# Patient Record
Sex: Female | Born: 1955 | Race: Black or African American | Hispanic: No | Marital: Single | Smoking: Current every day smoker
Health system: Southern US, Community
[De-identification: ages and names within clinical notes are randomized; demographics above are authoritative.]

## PROBLEM LIST (undated history)

## (undated) DIAGNOSIS — S42402A Unspecified fracture of lower end of left humerus, initial encounter for closed fracture: Secondary | ICD-10-CM

---

## 1999-06-16 ENCOUNTER — Emergency Department (HOSPITAL_COMMUNITY): Admission: EM | Admit: 1999-06-16 | Discharge: 1999-06-16 | Payer: Self-pay | Admitting: Emergency Medicine

## 2001-01-22 ENCOUNTER — Emergency Department (HOSPITAL_COMMUNITY): Admission: EM | Admit: 2001-01-22 | Discharge: 2001-01-22 | Payer: Self-pay | Admitting: Emergency Medicine

## 2002-06-10 ENCOUNTER — Emergency Department (HOSPITAL_COMMUNITY): Admission: EM | Admit: 2002-06-10 | Discharge: 2002-06-10 | Payer: Self-pay | Admitting: Emergency Medicine

## 2004-01-22 ENCOUNTER — Emergency Department (HOSPITAL_COMMUNITY): Admission: EM | Admit: 2004-01-22 | Discharge: 2004-01-22 | Payer: Self-pay | Admitting: Emergency Medicine

## 2004-09-28 ENCOUNTER — Emergency Department (HOSPITAL_COMMUNITY): Admission: EM | Admit: 2004-09-28 | Discharge: 2004-09-28 | Payer: Self-pay | Admitting: Emergency Medicine

## 2007-09-25 ENCOUNTER — Emergency Department (HOSPITAL_COMMUNITY): Admission: EM | Admit: 2007-09-25 | Discharge: 2007-09-25 | Payer: Self-pay | Admitting: Family Medicine

## 2009-09-09 ENCOUNTER — Emergency Department (HOSPITAL_COMMUNITY): Admission: EM | Admit: 2009-09-09 | Discharge: 2009-09-09 | Payer: Self-pay | Admitting: Family Medicine

## 2009-11-09 ENCOUNTER — Emergency Department (HOSPITAL_COMMUNITY): Admission: EM | Admit: 2009-11-09 | Discharge: 2009-11-09 | Payer: Self-pay | Admitting: Emergency Medicine

## 2010-08-09 ENCOUNTER — Encounter (INDEPENDENT_AMBULATORY_CARE_PROVIDER_SITE_OTHER): Payer: Self-pay | Admitting: General Surgery

## 2010-08-30 ENCOUNTER — Inpatient Hospital Stay (HOSPITAL_COMMUNITY): Admission: EM | Admit: 2010-08-30 | Discharge: 2010-08-10 | Payer: Self-pay | Admitting: Emergency Medicine

## 2010-12-04 LAB — DIFFERENTIAL
Basophils Absolute: 0 10*3/uL (ref 0.0–0.1)
Eosinophils Relative: 1 % (ref 0–5)
Lymphocytes Relative: 14 % (ref 12–46)
Lymphs Abs: 1.5 10*3/uL (ref 0.7–4.0)
Neutrophils Relative %: 79 % — ABNORMAL HIGH (ref 43–77)

## 2010-12-04 LAB — COMPREHENSIVE METABOLIC PANEL
AST: 20 U/L (ref 0–37)
Alkaline Phosphatase: 67 U/L (ref 39–117)
BUN: 5 mg/dL — ABNORMAL LOW (ref 6–23)
BUN: 9 mg/dL (ref 6–23)
CO2: 27 mEq/L (ref 19–32)
Calcium: 9.1 mg/dL (ref 8.4–10.5)
Chloride: 103 mEq/L (ref 96–112)
Chloride: 104 mEq/L (ref 96–112)
Creatinine, Ser: 0.94 mg/dL (ref 0.4–1.2)
Creatinine, Ser: 0.97 mg/dL (ref 0.4–1.2)
GFR calc Af Amer: >60 mL/min (ref >60)
GFR calc non Af Amer: >60 mL/min (ref >60)
GFR calc non Af Amer: >60 mL/min (ref >60)
Glucose, Bld: 134 mg/dL — ABNORMAL HIGH (ref 70–99)
Glucose, Bld: 84 mg/dL (ref 70–99)
Potassium: 3.6 mEq/L (ref 3.5–5.1)
Total Bilirubin: 0.6 mg/dL (ref 0.3–1.2)
Total Bilirubin: 0.7 mg/dL (ref 0.3–1.2)

## 2010-12-04 LAB — URINE MICROSCOPIC-ADD ON

## 2010-12-04 LAB — URINALYSIS, ROUTINE W REFLEX MICROSCOPIC
Bilirubin Urine: NEGATIVE
Ketones, ur: 15 mg/dL — AB
Nitrite: NEGATIVE
Protein, ur: 30 mg/dL — AB
Specific Gravity, Urine: 1.023 (ref 1.005–1.030)
Urobilinogen, UA: 1 mg/dL (ref 0.0–1.0)

## 2010-12-04 LAB — CBC
HCT: 33.4 % — ABNORMAL LOW (ref 36.0–46.0)
HCT: 37.9 % (ref 36.0–46.0)
Hemoglobin: 12.8 g/dL (ref 12.0–15.0)
MCH: 32.6 pg (ref 26.0–34.0)
MCH: 32.7 pg (ref 26.0–34.0)
MCHC: 33.8 g/dL (ref 30.0–36.0)
MCV: 96.5 fL (ref 78.0–100.0)
MCV: 97.2 fL (ref 78.0–100.0)
RBC: 3.43 MIL/uL — ABNORMAL LOW (ref 3.87–5.11)
RBC: 3.93 MIL/uL (ref 3.87–5.11)
RDW: 14.5 % (ref 11.5–15.5)
WBC: 7.1 10*3/uL (ref 4.0–10.5)

## 2010-12-04 LAB — PREGNANCY, URINE: Preg Test, Ur: NEGATIVE

## 2010-12-04 LAB — RAPID URINE DRUG SCREEN, HOSP PERFORMED
Barbiturates: NOT DETECTED
Cocaine: POSITIVE — AB

## 2010-12-04 LAB — LIPASE, BLOOD: Lipase: 23 U/L (ref 11–59)

## 2010-12-13 LAB — DIFFERENTIAL
Eosinophils Absolute: 0.2 10*3/uL (ref 0.0–0.7)
Eosinophils Relative: 3 % (ref 0–5)
Lymphs Abs: 1.9 10*3/uL (ref 0.7–4.0)
Monocytes Relative: 9 % (ref 3–12)

## 2010-12-13 LAB — CBC
HCT: 46.8 % — ABNORMAL HIGH (ref 36.0–46.0)
MCV: 94.7 fL (ref 78.0–100.0)
Platelets: 380 10*3/uL (ref 150–400)
WBC: 8.5 10*3/uL (ref 4.0–10.5)

## 2010-12-13 LAB — RAPID URINE DRUG SCREEN, HOSP PERFORMED
Barbiturates: NOT DETECTED
Barbiturates: NOT DETECTED
Cocaine: POSITIVE — AB
Opiates: NOT DETECTED
Opiates: NOT DETECTED
Tetrahydrocannabinol: NOT DETECTED

## 2010-12-13 LAB — URINE CULTURE

## 2010-12-13 LAB — POCT CARDIAC MARKERS
CKMB, poc: 1.3 ng/mL (ref 1.0–8.0)
Troponin i, poc: <0.05 ng/mL (ref 0.00–0.09)

## 2010-12-13 LAB — BASIC METABOLIC PANEL
BUN: 12 mg/dL (ref 6–23)
Chloride: 94 mEq/L — ABNORMAL LOW (ref 96–112)
Potassium: 3.4 mEq/L — ABNORMAL LOW (ref 3.5–5.1)

## 2010-12-13 LAB — PROTIME-INR: Prothrombin Time: 12.3 seconds (ref 11.6–15.2)

## 2010-12-13 LAB — POCT URINALYSIS DIP (DEVICE)
Bilirubin Urine: NEGATIVE
Glucose, UA: NEGATIVE mg/dL
Nitrite: NEGATIVE

## 2010-12-24 LAB — POCT URINALYSIS DIP (DEVICE)
Bilirubin Urine: NEGATIVE
Glucose, UA: NEGATIVE mg/dL
Ketones, ur: NEGATIVE mg/dL
Specific Gravity, Urine: <=1.005 (ref 1.005–1.030)

## 2011-07-23 ENCOUNTER — Emergency Department (HOSPITAL_COMMUNITY)
Admission: EM | Admit: 2011-07-23 | Discharge: 2011-07-23 | Disposition: A | Discharge status: Home / Self Care | Payer: Self-pay | Attending: Emergency Medicine | Admitting: Emergency Medicine

## 2011-07-23 DIAGNOSIS — M129 Arthropathy, unspecified: Secondary | ICD-10-CM | POA: Insufficient documentation

## 2011-07-23 DIAGNOSIS — M25529 Pain in unspecified elbow: Secondary | ICD-10-CM | POA: Insufficient documentation

## 2011-07-23 DIAGNOSIS — M25569 Pain in unspecified knee: Secondary | ICD-10-CM | POA: Insufficient documentation

## 2014-04-27 ENCOUNTER — Emergency Department (INDEPENDENT_AMBULATORY_CARE_PROVIDER_SITE_OTHER): Payer: Self-pay

## 2014-04-27 ENCOUNTER — Encounter (HOSPITAL_COMMUNITY): Payer: Self-pay | Admitting: Emergency Medicine

## 2014-04-27 ENCOUNTER — Emergency Department (INDEPENDENT_AMBULATORY_CARE_PROVIDER_SITE_OTHER)
Admission: EM | Admit: 2014-04-27 | Discharge: 2014-04-27 | Disposition: A | Discharge status: Home / Self Care | Payer: Self-pay | Source: Home / Self Care | Attending: Emergency Medicine | Admitting: Emergency Medicine

## 2014-04-27 DIAGNOSIS — S59909A Unspecified injury of unspecified elbow, initial encounter: Secondary | ICD-10-CM

## 2014-04-27 DIAGNOSIS — S59902A Unspecified injury of left elbow, initial encounter: Secondary | ICD-10-CM

## 2014-04-27 DIAGNOSIS — S6990XA Unspecified injury of unspecified wrist, hand and finger(s), initial encounter: Secondary | ICD-10-CM

## 2014-04-27 DIAGNOSIS — W06XXXA Fall from bed, initial encounter: Secondary | ICD-10-CM

## 2014-04-27 DIAGNOSIS — Z5189 Encounter for other specified aftercare: Secondary | ICD-10-CM

## 2014-04-27 DIAGNOSIS — S59919A Unspecified injury of unspecified forearm, initial encounter: Secondary | ICD-10-CM

## 2014-04-27 DIAGNOSIS — S5000XA Contusion of unspecified elbow, initial encounter: Secondary | ICD-10-CM

## 2014-04-27 MED ORDER — HYDROCODONE-ACETAMINOPHEN 5-325 MG PO TABS
ORAL_TABLET | ORAL | Status: AC
  Filled 2014-04-27: qty 1

## 2014-04-27 MED ORDER — HYDROCODONE-ACETAMINOPHEN 5-325 MG PO TABS: 1.0000 | ORAL_TABLET | Freq: Four times a day (QID) | ORAL | Status: DC | PRN

## 2014-04-27 MED ORDER — HYDROCODONE-ACETAMINOPHEN 5-325 MG PO TABS
1.0000 | ORAL_TABLET | Freq: Once | ORAL | Status: AC
  Administered 2014-04-27: 1 via ORAL

## 2014-04-27 NOTE — Discharge Instructions (Signed)
It's hard to tell if you broke your elbow again or if it is just flared up. We put a splint on your arm just in case.   Alternate tylenol and motrin every 4 hours as needed for pain. Take Norco every 6 hours as needed for severe pain.  Please call Dr. Debroah Loop office (an orthopedic doctor) and schedule an appointment for Friday or Monday.

## 2014-04-27 NOTE — ED Provider Notes (Signed)
CSN: [id number]     Arrival date & time 04/27/14  1214 History   First MD Initiated Contact with Patient 04/27/14 1254     Chief Complaint  Patient presents with  . Elbow Injury   (Consider location/radiation/quality/duration/timing/severity/associated sxs/prior Treatment) HPI She is here today for evaluation of left elbow pain and swelling. She states between 5 and 6 this morning she fell out of bed. She had immediate throbbing pain in her left elbow. The pain has stayed constant to slightly worse. It will shoot down her arm to her fingers. She also reports swelling over the back of the elbow. This seems to have stabilized. She is able to pronate and supinate her forearm with minimal pain. Elbow flexion and extension are limited secondary to pain. She denies any coldness, numbness, tingling in the left hand.  She has previously injured that elbow. She states she always has some difficulty with flexion and extension of the elbow, but it is currently quite a bit worse.  History reviewed. No pertinent past medical history. History reviewed. No pertinent past surgical history. History reviewed. No pertinent family history. History  Substance Use Topics  . Smoking status: Never Smoker   . Smokeless tobacco: Not on file  . Alcohol Use: No   OB History   Grav Para Term Preterm Abortions TAB SAB Ect Mult Living                 Review of Systems  Musculoskeletal:       Left elbow pain  Neurological: Negative for weakness and numbness.    Allergies  Review of patient's allergies indicates no known allergies.  Home Medications   Prior to Admission medications   Medication Sig Start Date End Date Taking? Authorizing Provider  HYDROcodone-acetaminophen (NORCO/VICODIN) 5-325 MG per tablet Take 1-2 tablets by mouth every 6 (six) hours as needed for moderate pain. 04/27/14   Melony Overly, MD   BP 164/95  Pulse 59  Temp(Src) 98.6 F (37 C) (Oral)  Resp 18  SpO2 99% Physical Exam   Constitutional: She is oriented to person, place, and time. She appears well-developed and well-nourished. No distress.  Thin woman  HENT:  Head: Normocephalic and atraumatic.  Cardiovascular: Normal rate.   Pulmonary/Chest: Effort normal.  Musculoskeletal:  Left elbow: swelling noted over olecranon; tender to palpation over posterior elbow and distal humerus; 5/5 grip strength; 2+ radial pulse; brisk cap refill in all digits; able to fully pronate and supinate the forearm; elbow flexion limited to 80 degrees, extension limited to 45 degrees  Neurological: She is alert and oriented to person, place, and time.  Skin: Skin is warm and dry.    ED Course  Procedures (including critical care time) Labs Review Labs Reviewed - No data to display  Imaging Review Dg Elbow Complete Left  04/27/2014   CLINICAL DATA:  Pain post trauma  EXAM: LEFT ELBOW - COMPLETE 3+ VIEW  COMPARISON:  None.  FINDINGS: Frontal, lateral, and bilateral oblique views were obtained. There is evidence of a previous fracture of the distal humeral metaphysis laterally extending into the elbow joint. There is remodeling in this area with mild nonunion at the fracture site. There is a small joint effusion.  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  No acute fracture or dislocation is appreciable.  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 While all the changes under seen may be chronic, there may be re-injury in this area. Note that there  is a small joint effusion. There is extensive osteoarthritic change. No dislocation appreciable.   Electronically Signed   By: Lowella Grip M.D.   On: 04/27/2014 13:13     MDM   1. Elbow injury, left, initial encounter    Spoke with Dr. Percell Miller of orthopedics. He recommended applying a long arm splint and followup in his office on Friday or Monday. Norco one tablet given in the office for pain control. Recommended alternating Tylenol  and Motrin for pain. Her prescription for Norco 5/325 mg, #20 provided. Followup with Dr. Percell Miller on Friday or Monday.    Melony Overly, MD 04/27/14 1330

## 2014-04-27 NOTE — ED Notes (Signed)
Reports falling out of bed this a.m injuring left elbow.  Limited ROM.  Swelling.  States "throbbing pain through out arm".

## 2014-04-27 NOTE — Progress Notes (Signed)
Orthopedic Tech Progress Note Patient Details:  Traci Buchanan 21-Nov-1955 [id number]  Ortho Devices Type of Ortho Device: Post (long arm) splint;Ace wrap;Arm sling Ortho Device/Splint Interventions: Application   Cammer, Theodoro Parma 04/27/2014, 2:05 PM

## 2014-04-27 NOTE — ED Notes (Signed)
Pt waiting ortho tech.  Mw,cma

## 2014-05-02 ENCOUNTER — Emergency Department (INDEPENDENT_AMBULATORY_CARE_PROVIDER_SITE_OTHER): Payer: Self-pay

## 2014-05-02 ENCOUNTER — Encounter (HOSPITAL_COMMUNITY): Payer: Self-pay | Admitting: Emergency Medicine

## 2014-05-02 ENCOUNTER — Emergency Department (INDEPENDENT_AMBULATORY_CARE_PROVIDER_SITE_OTHER)
Admission: EM | Admit: 2014-05-02 | Discharge: 2014-05-02 | Disposition: A | Discharge status: Home / Self Care | Payer: Self-pay | Source: Home / Self Care | Attending: Family Medicine | Admitting: Family Medicine

## 2014-05-02 DIAGNOSIS — Z5189 Encounter for other specified aftercare: Secondary | ICD-10-CM

## 2014-05-02 DIAGNOSIS — S5000XA Contusion of unspecified elbow, initial encounter: Secondary | ICD-10-CM

## 2014-05-02 DIAGNOSIS — S5002XD Contusion of left elbow, subsequent encounter: Secondary | ICD-10-CM

## 2014-05-02 DIAGNOSIS — W06XXXA Fall from bed, initial encounter: Secondary | ICD-10-CM

## 2014-05-02 HISTORY — DX: Unspecified fracture of lower end of left humerus, initial encounter for closed fracture: S42.402A

## 2014-05-02 MED ORDER — IBUPROFEN 800 MG PO TABS
800.0000 mg | ORAL_TABLET | Freq: Three times a day (TID) | ORAL | Status: DC
  Administered 2014-05-02: 800 mg via ORAL

## 2014-05-02 MED ORDER — IBUPROFEN 800 MG PO TABS
ORAL_TABLET | ORAL | Status: AC
  Filled 2014-05-02: qty 1

## 2014-05-02 NOTE — ED Provider Notes (Signed)
CSN: [id number]     Arrival date & time 05/02/14  1656 History   First MD Initiated Contact with Patient 05/02/14 1713     Chief Complaint  Patient presents with  . Elbow Pain   (Consider location/radiation/quality/duration/timing/severity/associated sxs/prior Treatment) Patient is a 59 y.o. female presenting with arm injury. The history is provided by the patient.  Arm Injury Location:  Elbow Injury: yes   Mechanism of injury: fall   Mechanism of injury comment:  [place] out of bed on 8/5 with injury to left elbow, was splinted, here for recheck. Fall:    Impact surface:  Carpet Elbow location:  L elbow Chronicity:  New Dislocation: no   Associated symptoms: decreased range of motion and stiffness   Risk factors comment:  Old injury , deformity of elbow.   Past Medical History  Diagnosis Date  . Elbow fracture, left    History reviewed. No pertinent past surgical history. No family history on file. History  Substance Use Topics  . Smoking status: Never Smoker   . Smokeless tobacco: Not on file  . Alcohol Use: No   OB History   Grav Para Term Preterm Abortions TAB SAB Ect Mult Living                 Review of Systems  Constitutional: Negative.   Musculoskeletal: Positive for joint swelling and stiffness.    Allergies  Review of patient's allergies indicates no known allergies.  Home Medications   Prior to Admission medications   Medication Sig Start Date End Date Taking? Authorizing Provider  HYDROcodone-acetaminophen (NORCO/VICODIN) 5-325 MG per tablet Take 1-2 tablets by mouth every 6 (six) hours as needed for moderate pain. 04/27/14   Melony Overly, MD   BP 143/93  Pulse 78  Temp(Src) 98.5 F (36.9 C) (Oral)  Resp 16  SpO2 99% Physical Exam  Nursing note and vitals reviewed. Constitutional: She is oriented to person, place, and time. She appears well-developed and well-nourished.  Musculoskeletal: She exhibits tenderness.       Left elbow: She exhibits  decreased range of motion, swelling and deformity.  Neurological: She is alert and oriented to person, place, and time.  Skin: Skin is warm and dry.    ED Course  Procedures (including critical care time) Labs Review Labs Reviewed - No data to display  Imaging Review Dg Elbow Complete Left  05/02/2014   CLINICAL DATA:  Fall 5 days ago. Left elbow injury and pain. Previous elbow fracture.  EXAM: LEFT ELBOW - COMPLETE 3+ VIEW  COMPARISON:  04/27/2014  FINDINGS: There is no evidence of acute fracture, dislocation, or joint effusion. There is no evidence of arthropathy or other focal bone abnormality. Soft tissues are unremarkable.  Old fracture deformity is seen involving the lateral condyle of the distal humerus. Severe osteoarthritis also demonstrated.  IMPRESSION: No acute findings.  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   Electronically Signed   By: Earle Gell M.D.   On: 05/02/2014 18:45     MDM   1. Contusion of left elbow, subsequent encounter        Billy Fischer, MD 05/02/14 [phone]

## 2014-05-02 NOTE — ED Notes (Signed)
Patient here for recheck of left elbow, seen last week for injury-8/5.  Patient fell out of bed.  Patient has injured this elbow prior to last weeks injury.  Radial pulse 2 plus, able to move fingers and arm

## 2014-05-02 NOTE — Discharge Instructions (Signed)
Wear sling for comfort, soak in warm water for soreness, advil as needed.

## 2014-07-28 ENCOUNTER — Emergency Department (INDEPENDENT_AMBULATORY_CARE_PROVIDER_SITE_OTHER)
Admission: EM | Admit: 2014-07-28 | Discharge: 2014-07-28 | Disposition: A | Discharge status: Home / Self Care | Payer: Self-pay | Source: Home / Self Care

## 2014-07-28 ENCOUNTER — Encounter (HOSPITAL_COMMUNITY): Payer: Self-pay | Admitting: *Deleted

## 2014-07-28 ENCOUNTER — Ambulatory Visit (HOSPITAL_COMMUNITY)
Admit: 2014-07-28 | Discharge: 2014-07-28 | Disposition: A | Discharge status: Home / Self Care | Payer: Self-pay | Source: Ambulatory Visit | Attending: Emergency Medicine | Admitting: Emergency Medicine

## 2014-07-28 DIAGNOSIS — R079 Chest pain, unspecified: Secondary | ICD-10-CM | POA: Insufficient documentation

## 2014-07-28 DIAGNOSIS — R05 Cough: Secondary | ICD-10-CM | POA: Insufficient documentation

## 2014-07-28 DIAGNOSIS — R0789 Other chest pain: Secondary | ICD-10-CM

## 2014-07-28 MED ORDER — HYDROCODONE-ACETAMINOPHEN 5-325 MG PO TABS
1.0000 | ORAL_TABLET | Freq: Once | ORAL | Status: AC
  Administered 2014-07-28: 1 via ORAL

## 2014-07-28 MED ORDER — HYDROCODONE-ACETAMINOPHEN 5-325 MG PO TABS
ORAL_TABLET | ORAL | Status: AC
  Filled 2014-07-28: qty 1

## 2014-07-28 MED ORDER — BENZONATATE 200 MG PO CAPS: 200.0000 mg | ORAL_CAPSULE | Freq: Three times a day (TID) | ORAL | Status: DC | PRN

## 2014-07-28 MED ORDER — MELOXICAM 15 MG PO TABS: 15.0000 mg | ORAL_TABLET | Freq: Every day | ORAL | Status: DC

## 2014-07-28 MED ORDER — HYDROCODONE-ACETAMINOPHEN 5-325 MG PO TABS: ORAL_TABLET | ORAL | Status: DC

## 2014-07-28 NOTE — ED Notes (Signed)
Pt  Reports  Pain r side x 5 days    She  Reports  It is  aggrevated by  Movement  posistions  And cough  - pt  Reports  Has  Had  Some  Cough  And congestion over last  sev  Days  - The  Patient  denys  Any  specefic  Injury   The  Pt is sitting   Upright  On the  Exam tablespeaking in complete  sentances  And is in no severe    Acute  Distress

## 2014-07-28 NOTE — ED Notes (Deleted)
Pt  Reports   Symptoms of  Burning  On urination    With  Symptoms x  sev  Weeks        -pt  denys  Any  Discharge  Or  Sores  On  Penis           -Pt   Wants  To be  Checked for  Std

## 2014-07-28 NOTE — Discharge Instructions (Signed)

## 2014-07-28 NOTE — ED Provider Notes (Signed)
Chief Complaint   Back Pain   History of Present Illness   Traci Buchanan is a 58 year old female who presents with right antero-lateral lower chest pain in the context of a URI.  The chest is tender to palpation and also with coughing, movement and deep inspiration.  She has a 2 week history of nasal congestion, rhinorrhea, sore throat and ear congestion.  She has also had cough productive of white sputum and slight shortness of breath.  She had fever at onset of URI symptoms 2 weeks ago, but none since then.    Review of Systems   Other than as noted above, the patient denies any of the following symptoms: Systemic:  No fevers, chills, sweats, or myalgias. Eye:  No redness or discharge. ENT:  No ear pain, headache, nasal congestion, drainage, sinus pressure, or sore throat. Neck:  No neck pain, stiffness, or swollen glands. Lungs:  No cough, sputum production, hemoptysis, wheezing, chest tightness, shortness of breath or chest pain. GI:  No abdominal pain, nausea, vomiting or diarrhea.  [place]   Past medical history, family history, social history, meds, and allergies were reviewed. She smokes 1/2 pack per day.   Physical exam   Vital signs:  BP 122/90 mmHg  Pulse 73  Temp(Src) 98.5 F (36.9 C) (Oral)  Resp 16  SpO2 100% General:  Alert and oriented.  In no distress.  Skin warm and dry. Eye:  No conjunctival injection or drainage. Lids were normal. ENT:  TMs and canals were normal, without erythema or inflammation.  Nasal mucosa was clear and uncongested, without drainage.  Mucous membranes were moist.  Pharynx was clear with no exudate or drainage.  There were no oral ulcerations or lesions. Neck:  Supple, no adenopathy, tenderness or mass. Lungs:  No respiratory distress.  Lungs were clear to auscultation, without wheezes, rales or rhonchi.  Breath sounds were clear and equal bilaterally.  Heart:  Regular rhythm, without gallops, murmers or rubs. Chest:  She has localized  tenderness to palpation at costal margin of right antero-lateral chest area.  Skin:  Clear, warm, and dry, without rash or lesions.  Radiology   Dg Ribs Unilateral W/chest Right  07/28/2014   CLINICAL DATA:  Cough. Pain in the right mid rib region. Upper respiratory infection for the past 2 weeks.  EXAM: RIGHT RIBS AND CHEST - 3+ VIEW  COMPARISON:  11/09/2009.  FINDINGS: Normal sized heart. Clear lungs. Mild scoliosis. No acute bony abnormality. Specifically, no abnormality of the right ribs.  IMPRESSION: No acute abnormality.   Electronically Signed   By: Enrique Sack M.D.   On: 07/28/2014 13:55   Course in Urgent [place]   The following medications were given:  Medications  HYDROcodone-acetaminophen (NORCO/VICODIN) 5-325 MG per tablet 1 tablet (1 tablet Oral Given 07/28/14 1320)   Assessment     The primary encounter diagnosis was Musculoskeletal chest pain. A diagnosis of Chest pain was also pertinent to this visit.  There is no evidence of pneumonia, strep throat, sinusitis, otitis media.    Plan    1.  Meds:  The following meds were prescribed:   Discharge Medication List as of 07/28/2014  2:46 PM    START taking these medications   Details  benzonatate (TESSALON) 200 MG capsule Take 1 capsule (200 mg total) by mouth 3 (three) times daily as needed for cough., Starting 07/28/2014, Until Discontinued, Normal    !! HYDROcodone-acetaminophen (NORCO/VICODIN) 5-325 MG per tablet 1 to 2  tabs every 4 to 6 hours as needed for pain., Print    meloxicam (MOBIC) 15 MG tablet Take 1 tablet (15 mg total) by mouth daily., Starting 07/28/2014, Until Discontinued, Normal     !! - Potential duplicate medications found. Please discuss with provider.      2.  Patient Education/Counseling:  The patient was given appropriate handouts, self care instructions, and instructed in symptomatic relief.  Instructed to get extra fluids and extra rest.    3.  Follow up:  The patient was told to follow  up here if no better in 3 to 4 days, or sooner if becoming worse in any way, and given some red flag symptoms such as increasing fever, difficulty breathing, chest pain, or persistent vomiting which would prompt immediate return.       Harden Mo, MD 07/28/14 [phone]

## 2014-09-28 ENCOUNTER — Encounter (HOSPITAL_COMMUNITY): Payer: Self-pay | Admitting: Emergency Medicine

## 2014-09-28 ENCOUNTER — Emergency Department (INDEPENDENT_AMBULATORY_CARE_PROVIDER_SITE_OTHER)
Admission: EM | Admit: 2014-09-28 | Discharge: 2014-09-28 | Disposition: A | Discharge status: Home / Self Care | Payer: Self-pay | Source: Home / Self Care | Attending: Family Medicine | Admitting: Family Medicine

## 2014-09-28 DIAGNOSIS — K047 Periapical abscess without sinus: Secondary | ICD-10-CM

## 2014-09-28 MED ORDER — CLINDAMYCIN HCL 300 MG PO CAPS: 300.0000 mg | ORAL_CAPSULE | Freq: Three times a day (TID) | ORAL | Status: DC

## 2014-09-28 MED ORDER — DICLOFENAC POTASSIUM 50 MG PO TABS: 50.0000 mg | ORAL_TABLET | Freq: Three times a day (TID) | ORAL | Status: DC

## 2014-09-28 NOTE — Discharge Instructions (Signed)
Take medicine as prescribed, see your dentist as soon as possible °

## 2014-09-28 NOTE — ED Notes (Signed)
Pt states that she has had dental pain for over a week in the lower left side of mouth. Pt states that she does not see a dentist

## 2014-09-28 NOTE — ED Provider Notes (Signed)
CSN: [id number]     Arrival date & time 09/28/14  87 History   First MD Initiated Contact with Patient 09/28/14 1609     Chief Complaint  Patient presents with  . Dental Pain   (Consider location/radiation/quality/duration/timing/severity/associated sxs/prior Treatment) Patient is a 59 y.o. female presenting with tooth pain. The history is provided by the patient.  Dental Pain Location:  Lower Lower teeth location:  18/LL 2nd molar Quality:  Throbbing Severity:  Moderate Onset quality:  Gradual Duration:  1 week Progression:  Worsening Chronicity:  Chronic Context: dental caries and poor dentition   Associated symptoms: facial pain, facial swelling and gum swelling   Risk factors: lack of dental care and smoking     Past Medical History  Diagnosis Date  . Elbow fracture, left    History reviewed. No pertinent past surgical history. History reviewed. No pertinent family history. History  Substance Use Topics  . Smoking status: Current Every Day Smoker -- 0.50 packs/day    Types: Cigarettes  . Smokeless tobacco: Not on file  . Alcohol Use: No   OB History    No data available     Review of Systems  Constitutional: Negative.   HENT: Positive for dental problem and facial swelling.     Allergies  Review of patient's allergies indicates no known allergies.  Home Medications   Prior to Admission medications   Medication Sig Start Date End Date Taking? Authorizing Provider  benzonatate (TESSALON) 200 MG capsule Take 1 capsule (200 mg total) by mouth 3 (three) times daily as needed for cough. 07/28/14   Harden Mo, MD  clindamycin (CLEOCIN) 300 MG capsule Take 1 capsule (300 mg total) by mouth 3 (three) times daily. 09/28/14   Billy Fischer, MD  diclofenac (CATAFLAM) 50 MG tablet Take 1 tablet (50 mg total) by mouth 3 (three) times daily. For dental pain 09/28/14   Billy Fischer, MD  HYDROcodone-acetaminophen (NORCO/VICODIN) 5-325 MG per tablet Take 1-2 tablets by mouth  every 6 (six) hours as needed for moderate pain. 04/27/14   Melony Overly, MD  HYDROcodone-acetaminophen (NORCO/VICODIN) 5-325 MG per tablet 1 to 2 tabs every 4 to 6 hours as needed for pain. 07/28/14   Harden Mo, MD  meloxicam (MOBIC) 15 MG tablet Take 1 tablet (15 mg total) by mouth daily. 07/28/14   Harden Mo, MD   BP 135/93 mmHg  Pulse 80  Temp(Src) 99.4 F (37.4 C) (Oral)  Resp 16  SpO2 98% Physical Exam  Constitutional: She is oriented to person, place, and time. She appears well-developed and well-nourished. No distress.  HENT:  Mouth/Throat:    Lymphadenopathy:    She has no cervical adenopathy.  Neurological: She is alert and oriented to person, place, and time.  Skin: Skin is warm and dry.  Nursing note and vitals reviewed.   ED Course  Procedures (including critical care time) Labs Review Labs Reviewed - No data to display  Imaging Review No results found.   MDM   1. Dental abscess        Billy Fischer, MD 09/28/14 [phone]

## 2014-11-05 ENCOUNTER — Encounter (HOSPITAL_COMMUNITY): Payer: Self-pay | Admitting: Emergency Medicine

## 2014-11-05 ENCOUNTER — Emergency Department (HOSPITAL_COMMUNITY): Payer: Self-pay

## 2014-11-05 ENCOUNTER — Emergency Department (HOSPITAL_COMMUNITY)
Admission: EM | Admit: 2014-11-05 | Discharge: 2014-11-05 | Disposition: A | Discharge status: Home / Self Care | Payer: Self-pay | Attending: Emergency Medicine | Admitting: Emergency Medicine

## 2014-11-05 DIAGNOSIS — R519 Headache, unspecified: Secondary | ICD-10-CM

## 2014-11-05 DIAGNOSIS — R4182 Altered mental status, unspecified: Secondary | ICD-10-CM | POA: Insufficient documentation

## 2014-11-05 DIAGNOSIS — T50905A Adverse effect of unspecified drugs, medicaments and biological substances, initial encounter: Secondary | ICD-10-CM

## 2014-11-05 DIAGNOSIS — R51 Headache: Secondary | ICD-10-CM | POA: Insufficient documentation

## 2014-11-05 DIAGNOSIS — F10929 Alcohol use, unspecified with intoxication, unspecified: Secondary | ICD-10-CM

## 2014-11-05 LAB — COMPREHENSIVE METABOLIC PANEL
ALBUMIN: 4.1 g/dL (ref 3.5–5.2)
ALT: 16 U/L (ref 0–35)
ANION GAP: 9 (ref 5–15)
AST: 24 U/L (ref 0–37)
Alkaline Phosphatase: 71 U/L (ref 39–117)
BILIRUBIN TOTAL: 0.8 mg/dL (ref 0.3–1.2)
BUN: 11 mg/dL (ref 6–23)
CALCIUM: 9.3 mg/dL (ref 8.4–10.5)
CO2: 27 mmol/L (ref 19–32)
CREATININE: 1.15 mg/dL — AB (ref 0.50–1.10)
Chloride: 100 mmol/L (ref 96–112)
GFR calc Af Amer: 60 mL/min — ABNORMAL LOW (ref >90)
GFR, EST NON AFRICAN AMERICAN: 52 mL/min — AB (ref >90)
Glucose, Bld: 83 mg/dL (ref 70–99)
Potassium: 3.8 mmol/L (ref 3.5–5.1)
Sodium: 136 mmol/L (ref 135–145)
TOTAL PROTEIN: 7.8 g/dL (ref 6.0–8.3)

## 2014-11-05 LAB — CBC WITH DIFFERENTIAL/PLATELET
BASOS ABS: 0 10*3/uL (ref 0.0–0.1)
Basophils Relative: 1 % (ref 0–1)
EOS ABS: 0.1 10*3/uL (ref 0.0–0.7)
Eosinophils Relative: 1 % (ref 0–5)
HEMATOCRIT: 42.4 % (ref 36.0–46.0)
Hemoglobin: 14.3 g/dL (ref 12.0–15.0)
LYMPHS ABS: 1.6 10*3/uL (ref 0.7–4.0)
Lymphocytes Relative: 29 % (ref 12–46)
MCH: 32.6 pg (ref 26.0–34.0)
MCHC: 33.7 g/dL (ref 30.0–36.0)
MCV: 96.8 fL (ref 78.0–100.0)
MONOS PCT: 6 % (ref 3–12)
Monocytes Absolute: 0.3 10*3/uL (ref 0.1–1.0)
NEUTROS PCT: 63 % (ref 43–77)
Neutro Abs: 3.6 10*3/uL (ref 1.7–7.7)
PLATELETS: 298 10*3/uL (ref 150–400)
RBC: 4.38 MIL/uL (ref 3.87–5.11)
RDW: 14.1 % (ref 11.5–15.5)
WBC: 5.6 10*3/uL (ref 4.0–10.5)

## 2014-11-05 LAB — CBG MONITORING, ED: Glucose-Capillary: 93 mg/dL (ref 70–99)

## 2014-11-05 LAB — RAPID URINE DRUG SCREEN, HOSP PERFORMED
AMPHETAMINES: NOT DETECTED
BENZODIAZEPINES: NOT DETECTED
Barbiturates: NOT DETECTED
COCAINE: POSITIVE — AB
Opiates: NOT DETECTED
TETRAHYDROCANNABINOL: NOT DETECTED

## 2014-11-05 LAB — ACETAMINOPHEN LEVEL: Acetaminophen (Tylenol), Serum: 10.7 ug/mL (ref 10–30)

## 2014-11-05 LAB — ETHANOL: Alcohol, Ethyl (B): 76 mg/dL — ABNORMAL HIGH (ref 0–9)

## 2014-11-05 NOTE — Discharge Instructions (Signed)
Alcohol Intoxication °Alcohol intoxication occurs when you drink enough alcohol that it affects your ability to function. It can be mild or very severe. Drinking a lot of alcohol in a short time is called binge drinking. This can be very harmful. Drinking alcohol can also be more dangerous if you are taking medicines or other drugs. Some of the effects caused by alcohol may include: °· Loss of coordination. °· Changes in mood and behavior. °· Unclear thinking. °· Trouble talking (slurred speech). °· Throwing up (vomiting). °· Confusion. °· Slowed breathing. °· Twitching and shaking (seizures). °· Loss of consciousness. °HOME CARE °· Do not drive after drinking alcohol. °· Drink enough water and fluids to keep your pee (urine) clear or pale yellow. Avoid caffeine. °· Only take medicine as told by your doctor. °GET HELP IF: °· You throw up (vomit) many times. °· You do not feel better after a few days. °· You frequently have alcohol intoxication. Your doctor can help decide if you should see a substance use treatment counselor. °GET HELP RIGHT AWAY IF: °· You become shaky when you stop drinking. °· You have twitching and shaking. °· You throw up blood. It may look bright red or like coffee grounds. °· You notice blood in your poop (bowel movements). °· You become lightheaded or pass out (faint). °MAKE SURE YOU:  °· Understand these instructions. °· Will watch your condition. °· Will get help right away if you are not doing well or get worse. °Document Released: 02/26/2008 Document Revised: 05/12/2013 Document Reviewed: 02/12/2013 °[employer]® Patient Information ©2015 [employer]. This information is not intended to replace advice given to you by your health care provider. Make sure you discuss any questions you have with your health care provider. ° °

## 2014-11-05 NOTE — ED Notes (Signed)
Pt back from ct

## 2014-11-05 NOTE — ED Notes (Signed)
Pt. reports headache onset this evening , took Tylenol PM and Benadryl this evening with slight relief , alert and oriented but somnolent / respirations unlabored .

## 2014-11-05 NOTE — ED Provider Notes (Signed)
CSN: [id number]     Arrival date & time 11/05/14  1916 History   First MD Initiated Contact with Patient 11/05/14 1929     Chief Complaint  Patient presents with  . Headache     (Consider location/radiation/quality/duration/timing/severity/associated sxs/prior Treatment) HPI 59 y.o. Female complaining of headache with nasal congestion and some yellowish nasal discharge. She states that she took Benadryl for this. She was drinking some alcohol. Her significant other is present with her and states that she seemed to have some sleepiness after taking the Benadryl and he was concerned secondary to this with some staggering of gait. She denies any difficulty walking, speaking, seeing, or moving. She has not had any recent head injury. Past Medical History  Diagnosis Date  . Elbow fracture, left    History reviewed. No pertinent past surgical history. No family history on file. History  Substance Use Topics  . Smoking status: Current Every Day Smoker -- 0.50 packs/day    Types: Cigarettes  . Smokeless tobacco: Not on file  . Alcohol Use: No   OB History    No data available     Review of Systems  All other systems reviewed and are negative.     Allergies  Review of patient's allergies indicates no known allergies.  Home Medications   Prior to Admission medications   Medication Sig Start Date End Date Taking? Authorizing Provider  benzonatate (TESSALON) 200 MG capsule Take 1 capsule (200 mg total) by mouth 3 (three) times daily as needed for cough. 07/28/14   Harden Mo, MD  clindamycin (CLEOCIN) 300 MG capsule Take 1 capsule (300 mg total) by mouth 3 (three) times daily. 09/28/14   Billy Fischer, MD  diclofenac (CATAFLAM) 50 MG tablet Take 1 tablet (50 mg total) by mouth 3 (three) times daily. For dental pain 09/28/14   Billy Fischer, MD  HYDROcodone-acetaminophen (NORCO/VICODIN) 5-325 MG per tablet Take 1-2 tablets by mouth every 6 (six) hours as needed for moderate pain.  04/27/14   Melony Overly, MD  HYDROcodone-acetaminophen (NORCO/VICODIN) 5-325 MG per tablet 1 to 2 tabs every 4 to 6 hours as needed for pain. 07/28/14   Harden Mo, MD  meloxicam (MOBIC) 15 MG tablet Take 1 tablet (15 mg total) by mouth daily. 07/28/14   Harden Mo, MD   BP 107/72 mmHg  Pulse 83  Temp(Src) 97.8 F (36.6 C) (Oral)  Resp 18  SpO2 97% Physical Exam  Constitutional: She is oriented to person, place, and time. She appears well-developed and well-nourished.  HENT:  Head: Normocephalic and atraumatic.  Right Ear: Tympanic membrane and external ear normal.  Left Ear: Tympanic membrane and external ear normal.  Nose: Nose normal. Right sinus exhibits no maxillary sinus tenderness and no frontal sinus tenderness. Left sinus exhibits no maxillary sinus tenderness and no frontal sinus tenderness.  Eyes: Conjunctivae and EOM are normal. Pupils are equal, round, and reactive to light. Right eye exhibits no nystagmus. Left eye exhibits no nystagmus.  Neck: Normal range of motion. Neck supple.  Cardiovascular: Normal rate, regular rhythm, normal heart sounds and intact distal pulses.   Pulmonary/Chest: Effort normal and breath sounds normal. No respiratory distress. She exhibits no tenderness.  Abdominal: Soft. Bowel sounds are normal. She exhibits no distension and no mass. There is no tenderness.  Musculoskeletal: Normal range of motion. She exhibits no edema or tenderness.  Neurological: She is alert and oriented to person, place, and time. She has normal strength and  normal reflexes. No sensory deficit. She displays a negative Romberg sign. GCS eye subscore is 4. GCS verbal subscore is 5. GCS motor subscore is 6.  Reflex Scores:      Tricep reflexes are 2+ on the right side and 2+ on the left side.      Bicep reflexes are 2+ on the right side and 2+ on the left side.      Brachioradialis reflexes are 2+ on the right side and 2+ on the left side.      Patellar reflexes are 2+ on  the right side and 2+ on the left side.      Achilles reflexes are 2+ on the right side and 2+ on the left side. Patient with normal gait without ataxia, shuffling, spasm, or antalgia. Speech is normal without dysarthria, dysphasia, or aphasia. Muscle strength is 5/5 in bilateral shoulders, elbow flexor and extensors, wrist flexor and extensors, and intrinsic hand muscles. 5/5 bilateral lower extremity hip flexors, extensors, knee flexors and extensors, and ankle dorsi and plantar flexors.    Skin: Skin is warm and dry. No rash noted.  Psychiatric: She has a normal mood and affect. Her behavior is normal. Judgment and thought content normal.  Nursing note and vitals reviewed.   ED Course  Procedures (including critical care time) Labs Review Labs Reviewed  COMPREHENSIVE METABOLIC PANEL - Abnormal; Notable for the following:    Creatinine, Ser 1.15 (*)    GFR calc non Af Amer 52 (*)    GFR calc Af Amer 60 (*)    All other components within normal limits  ETHANOL - Abnormal; Notable for the following:    Alcohol, Ethyl (B) 76 (*)    All other components within normal limits  CBC WITH DIFFERENTIAL/PLATELET  ACETAMINOPHEN LEVEL  URINE RAPID DRUG SCREEN (HOSP PERFORMED)  CBG MONITORING, ED    Imaging Review Ct Head Wo Contrast  11/05/2014   CLINICAL DATA:  59 year old female with new onset headache. somnolent. Initial encounter.  EXAM: CT HEAD WITHOUT CONTRAST  TECHNIQUE: Contiguous axial images were obtained from the base of the skull through the vertex without intravenous contrast.  COMPARISON:  11/09/2009  FINDINGS: Partially visible chronic maxillary sinusitis with mucoperiosteal thickened. Other Visualized paranasal sinuses and mastoids are clear. No acute osseous abnormality identified. Visualized orbit soft tissues are within normal limits. Visualized scalp soft tissues are within normal limits.  Stable cerebral volume. No midline shift, ventriculomegaly, mass effect, evidence of  mass lesion, intracranial hemorrhage or evidence of cortically based acute infarction. Gray-white matter differentiation is within normal limits throughout the brain. No suspicious intracranial vascular hyperdensity.  IMPRESSION: Stable and Normal for age non contrast CT appearance of the brain.   Electronically Signed   By: Genevie Ann M.D.   On: 11/05/2014 21:21     EKG Interpretation None      MDM   Final diagnoses:  Headache    /59 year old female who was drinking some alcohol tonight and took Benadryl. She had increased sleepiness afterwards. She has had some mild to moderate headache. I do not think there is any or bleed, or severe infection regarding the headache. Thinks that the altered mental status had to do with the combination of alcohol and Benadryl. Labs and CT scan here are essentially normal. Patient appears improved and is discharged home with return precautions and follow-up instructions given.   Shaune Pollack, MD 11/05/14 2228

## 2014-11-05 NOTE — ED Notes (Signed)
Patient is alert and orientedx4.  Patient was explained discharge instructions and they understood them with no questions.  The patient's fiancee, Rubin Payor is taking the patient home.

## 2014-11-05 NOTE — ED Notes (Signed)
Patient transported to CT 

## 2014-11-05 NOTE — ED Notes (Signed)
Family at bedside. 

## 2014-12-12 ENCOUNTER — Encounter (HOSPITAL_COMMUNITY): Payer: Self-pay

## 2014-12-12 ENCOUNTER — Emergency Department (INDEPENDENT_AMBULATORY_CARE_PROVIDER_SITE_OTHER)
Admission: EM | Admit: 2014-12-12 | Discharge: 2014-12-12 | Disposition: A | Discharge status: Home / Self Care | Payer: Self-pay | Source: Home / Self Care | Attending: Emergency Medicine | Admitting: Emergency Medicine

## 2014-12-12 DIAGNOSIS — G8929 Other chronic pain: Secondary | ICD-10-CM

## 2014-12-12 DIAGNOSIS — M549 Dorsalgia, unspecified: Secondary | ICD-10-CM

## 2014-12-12 DIAGNOSIS — S42302K Unspecified fracture of shaft of humerus, left arm, subsequent encounter for fracture with nonunion: Secondary | ICD-10-CM

## 2014-12-12 DIAGNOSIS — M79602 Pain in left arm: Secondary | ICD-10-CM

## 2014-12-12 MED ORDER — KETOROLAC TROMETHAMINE 60 MG/2ML IM SOLN
60.0000 mg | Freq: Once | INTRAMUSCULAR | Status: AC
  Administered 2014-12-12: 60 mg via INTRAMUSCULAR

## 2014-12-12 MED ORDER — KETOROLAC TROMETHAMINE 60 MG/2ML IM SOLN
INTRAMUSCULAR | Status: AC
  Filled 2014-12-12: qty 2

## 2014-12-12 NOTE — ED Notes (Signed)
C/o pain in left elbow and back for some time

## 2014-12-12 NOTE — ED Provider Notes (Signed)
CSN: [id number]     Arrival date & time 12/12/14  1747 History   First MD Initiated Contact with Patient 12/12/14 1935     Chief Complaint  Patient presents with  . Back Pain  . Arm Pain   (Consider location/radiation/quality/duration/timing/severity/associated sxs/prior Treatment) HPI           59 year old female presents complaining of left elbow pain. 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 It is a little worse today and she is requesting a prescription for hydrocodone. Also she has chronic back pain. She has had increased level of physical activity at work and is requesting narcotic pain medicine for that problem as well. She has already been taking tramadol, Tylenol 3, and ibuprofen with no relief. She does not have a primary care doctor. No loss of bowel or bladder control  Past Medical History  Diagnosis Date  . Elbow fracture, left    History reviewed. No pertinent past surgical history. History reviewed. No pertinent family history. History  Substance Use Topics  . Smoking status: Current Every Day Smoker -- 0.50 packs/day    Types: Cigarettes  . Smokeless tobacco: Not on file  . Alcohol Use: Yes   OB History    No data available     Review of Systems  Musculoskeletal: Positive for back pain and arthralgias.  All other systems reviewed and are negative.   Allergies  Review of patient's allergies indicates no known allergies.  Home Medications   Prior to Admission medications   Medication Sig Start Date End Date Taking? Authorizing Provider  benzonatate (TESSALON) 200 MG capsule Take 1 capsule (200 mg total) by mouth 3 (three) times daily as needed for cough. 07/28/14   Harden Mo, MD  clindamycin (CLEOCIN) 300 MG capsule Take 1 capsule (300 mg total) by mouth 3 (three) times daily. 09/28/14   Billy Fischer, MD  diclofenac (CATAFLAM) 50 MG tablet Take 1 tablet (50 mg total) by mouth 3 (three) times daily.  For dental pain 09/28/14   Billy Fischer, MD  HYDROcodone-acetaminophen (NORCO/VICODIN) 5-325 MG per tablet Take 1-2 tablets by mouth every 6 (six) hours as needed for moderate pain. 04/27/14   Melony Overly, MD  HYDROcodone-acetaminophen (NORCO/VICODIN) 5-325 MG per tablet 1 to 2 tabs every 4 to 6 hours as needed for pain. 07/28/14   Harden Mo, MD  meloxicam (MOBIC) 15 MG tablet Take 1 tablet (15 mg total) by mouth daily. 07/28/14   Harden Mo, MD   BP 134/88 mmHg  Pulse 89  Temp(Src) 98.4 F (36.9 C) (Oral)  Resp 15  SpO2 99% Physical Exam  Constitutional: She is oriented to person, place, and time. Vital signs are normal. She appears well-developed and well-nourished. No distress.  HENT:  Head: Normocephalic and atraumatic.  Cardiovascular:  Pulses:      Radial pulses are 2+ on the left side.  Pulmonary/Chest: Effort normal. No respiratory distress.  Musculoskeletal:       Left elbow: Tenderness (diffuse tenderness of the left elbow) found.  Neurological: She is alert and oriented to person, place, and time. She has normal strength. Coordination normal.  Skin: Skin is warm and dry. No rash noted. She is not diaphoretic.  Psychiatric: She has a normal mood and affect. Judgment normal.  Nursing note and vitals reviewed.   ED Course  Procedures (including critical care time) Labs Review Labs Reviewed - No  data to display  Imaging Review No results found.   MDM   1. Chronic pain of left upper extremity   2. Humerus fracture, left, with nonunion, subsequent encounter   3. Chronic back pain    I have explained to the patient that we do not treat chronic pain problems. We will give her a shot of Toradol here. I discussed with her the need to follow-up with primary care and I will provide her resources to arrange primary care follow-up.   Meds ordered this encounter  Medications  . ketorolac (TORADOL) injection 60 mg    Sig:        Liam Graham, PA-C 12/12/14  1951

## 2014-12-12 NOTE — Discharge Instructions (Signed)
[place]: [place]  [phone]  [place] [facility]: [place]   [phone]  [facility]: [address] [place]  [phone]  [place] [facility] : [address] [place] [phone]  [facility]: [address] [place] [phone]  [facility]: [place] [phone]  Dr. Blanchie Serve [place]  [phone]  Dr. Benito Mccreedy, [facility]. [place], [place] [address]  [phone]    Go to www.goodrx.com to look up your medications. This will give you a list of where you can find your prescriptions at the most affordable prices.   If you have no primary doctor, here are some resources that may be helpful:  [place] Providers: - [facility]- [address]  [phone];   - [place], [place] [phone]  - [place], [address] [phone] - [place]  [phone]  - Lucianne Lei- [address], [address] [phone]  Only accepts [place] patients       after they have her name applied to their card  -Dr. Benito Mccreedy, [facility]. [place]    [place], [place] [address]  [phone]  Self Pay (no insurance) in [place]: - Sickle Cell Patients: Dr Kevan Ny, [facility] [place]  - Physician Referral Service- [facility]- [address], [place], [phone];  Monday to Friday, 9 a.m. - 7 p.m.; Saturday 9 a.m. to 1 p.m.  [facility]- [address] [place], [place]      [facility]- [address] [phone]  -[facility], [place]; [id number]; or [address], [place]; [phone].   [employer] [facility] [place], [place] [address] [place]; [id number]; Monday to Wednesday, 8:30 a.m. - 5 p.m.; Thursday, 8:30 a.m. - 8 p.m.  [facility], [place], [place]; [id number]; Monday to Friday, 8 a.m. - 4:30 p.m.   [facility], [place] [address], [place], [place]; first and third Saturday of the month, 9:30 a.m. - 12:30 p.m.  [place] [facility], [address], [place], [place]; second Saturday of the month, 9 a.m. -noon.  [place] for children. For information, call [phone]; [phone]; or [phone].  Other agencies that provide inexpensive medical care:     [facility]  [facility]  [phone]    [facility]  [phone] [place]    [facility]  [phone]    [facility]  [phone], [phone]; or [phone].  Chronic Pain Problems Contact [facility]  [phone] Patients need to be referred by their primary care doctor.  [facility] [place]  [place] [address] [place]   [phone] (After Hours)  General Information: Finding a doctor when you do not have health insurance can be tricky. Although you are not limited by an insurance plan, you are of course limited by her finances  and how much but he can pay out of pocket.  What are your options if you don't have health insurance?   1) Find a Tax adviser and Pay Out of [place] Although you won't have to find out who is covered by your insurance plan, it is a good idea to ask around and get recommendations. You will then need to call the office and see if the doctor you have chosen will accept you as a new patient and what types of options they offer for patients who are self-pay. Some doctors offer discounts or will set up payment plans for their patients who do not have insurance, but you will need to ask so you aren't surprised when you get to your appointment.  2) Contact Your Local Health Department Not all health departments have doctors that can see patients for sick visits, but many do, so it is worth a call to see if yours does. If you don't know where your local health department is, you can check in your phone book. The CDC also has a tool to help you locate your state's health department, and many state websites also have listings of all of their local health departments.  3) Find a [facility] If your illness is not likely to be very severe or complicated, you may want to try a walk in clinic. These are popping up all over the country in pharmacies, drugstores, and shopping centers. They're usually staffed by nurse practitioners or physician assistants that have been trained to treat common illnesses and complaints. They're usually fairly quick and inexpensive. However, if you have serious medical issues or chronic medical problems, these are probably not your best option   Chronic Pain Chronic pain can be defined as pain that is off and on and lasts for 3-6 months or longer. Many things cause chronic pain, which can make it difficult to make a diagnosis. There are many treatment options available for chronic pain. However, finding a treatment that works well for you may require trying various approaches until the  right one is found. Many people benefit from a combination of two or more types of treatment to control their pain. SYMPTOMS  Chronic pain can occur anywhere in the body and can range from mild to very severe. Some types of chronic pain include:  Headache.  Low back pain.  Cancer pain.  Arthritis pain.  Neurogenic pain. This is pain resulting from damage to nerves. People with chronic pain may also have other symptoms such as:  Depression.  Anger.  Insomnia.  Anxiety. DIAGNOSIS  Your health care provider will help diagnose your condition over time. In many cases, the initial focus will be on excluding possible conditions that could be causing the pain. Depending on your symptoms, your health care provider may order tests to diagnose your condition. Some of these tests  may include:   Blood tests.   CT scan.   MRI.   X-rays.   Ultrasounds.   Nerve conduction studies.  You may need to see a specialist.  TREATMENT  Finding treatment that works well may take time. You may be referred to a pain specialist. He or she may prescribe medicine or therapies, such as:   Mindful meditation or yoga.  Shots (injections) of numbing or pain-relieving medicines into the spine or area of pain.  Local electrical stimulation.  Acupuncture.   Massage therapy.   Aroma, color, light, or sound therapy.   Biofeedback.   Working with a physical therapist to keep from getting stiff.   Regular, gentle exercise.   Cognitive or behavioral therapy.   Group support.  Sometimes, surgery may be recommended.  HOME CARE INSTRUCTIONS   Take all medicines as directed by your health care provider.   Lessen stress in your life by relaxing and doing things such as listening to calming music.   Exercise or be active as directed by your health care provider.   Eat a healthy diet and include things such as vegetables, fruits, fish, and lean meats in your diet.   Keep all  follow-up appointments with your health care provider.   Attend a support group with others suffering from chronic pain. SEEK MEDICAL CARE IF:   Your pain gets worse.   You develop a new pain that was not there before.   You cannot tolerate medicines given to you by your health care provider.   You have new symptoms since your last visit with your health care provider.  SEEK IMMEDIATE MEDICAL CARE IF:   You feel weak.   You have decreased sensation or numbness.   You lose control of bowel or bladder function.   Your pain suddenly gets much worse.   You develop shaking.  You develop chills.  You develop confusion.  You develop chest pain.  You develop shortness of breath.  MAKE SURE YOU:  Understand these instructions.  Will watch your condition.  Will get help right away if you are not doing well or get worse. Document Released: 06/01/2002 Document Revised: 05/12/2013 Document Reviewed: 03/05/2013 [facility] Patient Information 2015 [place], [place]. This information is not intended to replace advice given to you by your health care provider. Make sure you discuss any questions you have with your health care provider.

## 2014-12-27 ENCOUNTER — Emergency Department (INDEPENDENT_AMBULATORY_CARE_PROVIDER_SITE_OTHER): Payer: Self-pay

## 2014-12-27 ENCOUNTER — Emergency Department (INDEPENDENT_AMBULATORY_CARE_PROVIDER_SITE_OTHER)
Admission: EM | Admit: 2014-12-27 | Discharge: 2014-12-27 | Disposition: A | Discharge status: Home / Self Care | Payer: Self-pay | Source: Home / Self Care | Attending: Family Medicine | Admitting: Family Medicine

## 2014-12-27 ENCOUNTER — Encounter (HOSPITAL_COMMUNITY): Payer: Self-pay | Admitting: Emergency Medicine

## 2014-12-27 DIAGNOSIS — T148 Other injury of unspecified body region: Secondary | ICD-10-CM

## 2014-12-27 DIAGNOSIS — IMO0002 Reserved for concepts with insufficient information to code with codable children: Secondary | ICD-10-CM

## 2014-12-27 DIAGNOSIS — M25461 Effusion, right knee: Secondary | ICD-10-CM

## 2014-12-27 LAB — POCT I-STAT, CHEM 8
BUN: 12 mg/dL (ref 6–23)
CHLORIDE: 104 mmol/L (ref 96–112)
Calcium, Ion: 1.1 mmol/L — ABNORMAL LOW (ref 1.12–1.23)
Creatinine, Ser: 1.1 mg/dL (ref 0.50–1.10)
Glucose, Bld: 82 mg/dL (ref 70–99)
HCT: 49 % — ABNORMAL HIGH (ref 36.0–46.0)
HEMOGLOBIN: 16.7 g/dL — AB (ref 12.0–15.0)
Potassium: 4.6 mmol/L (ref 3.5–5.1)
SODIUM: 139 mmol/L (ref 135–145)
TCO2: 24 mmol/L (ref 0–100)

## 2014-12-27 LAB — URIC ACID: Uric Acid, Serum: 4.8 mg/dL (ref 2.4–7.0)

## 2014-12-27 MED ORDER — DEXAMETHASONE 4 MG PO TABS
ORAL_TABLET | ORAL | Status: AC
  Filled 2014-12-27: qty 2

## 2014-12-27 MED ORDER — ACETAMINOPHEN 325 MG PO TABS
ORAL_TABLET | ORAL | Status: AC
  Filled 2014-12-27: qty 3

## 2014-12-27 MED ORDER — DEXAMETHASONE 2 MG PO TABS
ORAL_TABLET | ORAL | Status: AC
  Filled 2014-12-27: qty 1

## 2014-12-27 MED ORDER — INDOMETHACIN 50 MG PO CAPS: 50.0000 mg | ORAL_CAPSULE | Freq: Three times a day (TID) | ORAL | Status: DC

## 2014-12-27 MED ORDER — TRAMADOL HCL 50 MG PO TABS
100.0000 mg | ORAL_TABLET | Freq: Once | ORAL | Status: AC
  Administered 2014-12-27: 100 mg via ORAL

## 2014-12-27 MED ORDER — DEXAMETHASONE 4 MG PO TABS
10.0000 mg | ORAL_TABLET | Freq: Once | ORAL | Status: AC
  Administered 2014-12-27: 10 mg via ORAL

## 2014-12-27 NOTE — Discharge Instructions (Signed)
You likely are experiencing a gout flare. Your treated with steroids in our clinic to help this condition. Please start the indomethacin as prescribed. We will contact you with regards to your lab work if needed. The Dermabond on her fingers, 1-2 weeks. Please come back or go to emergency room if you develop any signs of infection in her finger.

## 2014-12-27 NOTE — ED Provider Notes (Signed)
CSN: [id number]     Arrival date & time 12/27/14  1214 History   First MD Initiated Contact with Patient 12/27/14 1432     Chief Complaint  Patient presents with  . Knee Pain  . Laceration    right ring finger   (Consider location/radiation/quality/duration/timing/severity/associated sxs/prior Treatment) HPI  R knee pain: 3-4 days ago developed pain in the back of knee. Swollen. No trauma. Getting worse. Hot water soak, ibuprofen 400, alcohol rubs w/o benefit. Worse w/ ambulation.   R ring finger laceration. Cut finger while cutting an onion. Full movement preserved. Pain is constant not getting worse or better.   Last tetanus 1.83yrs ago.    Past Medical History  Diagnosis Date  . Elbow fracture, left    History reviewed. No pertinent past surgical history. Family History  Problem Relation Age of Onset  . Diabetes Mother    History  Substance Use Topics  . Smoking status: Current Every Day Smoker -- 0.50 packs/day    Types: Cigarettes  . Smokeless tobacco: Not on file  . Alcohol Use: Yes   OB History    No data available     Review of Systems Per HPI with all other pertinent systems negative.   Allergies  Review of patient's allergies indicates no known allergies.  Home Medications   Prior to Admission medications   Medication Sig Start Date End Date Taking? Authorizing Provider  indomethacin (INDOCIN) 50 MG capsule Take 1 capsule (50 mg total) by mouth 3 (three) times daily with meals. 12/27/14   Waldemar Dickens, MD   BP 134/98 mmHg  Pulse 70  Temp(Src) 97.8 F (36.6 C) (Oral)  Resp 16  SpO2 100% Physical Exam Physical Exam  Constitutional: oriented to person, place, and time. appears well-developed and well-nourished. No distress.  HENT:  Head: Normocephalic and atraumatic.  Eyes: EOMI. PERRL.  Neck: Normal range of motion.  Cardiovascular: RRR, no m/r/g, 2+ distal pulses,  Pulmonary/Chest: Effort normal and breath sounds normal. No respiratory  distress.  Abdominal: Soft. Bowel sounds are normal. NonTTP, no distension.  Musculoskeletal: Right knee slightly swollen without clear fusion. Popliteal fossa normal. Full range of motion. Lachman's, valgus and varus stresses normal..  Neurological: alert and oriented to person, place, and time.  Skin: Right ring finger with dorsal linear 2.1 cm laceration. No underlying tendon neurovascular bundle intact. Psychiatric: normal mood and affect. behavior is normal. Judgment and thought content normal.   ED Course  LACERATION REPAIR Date/Time: 12/27/2014 3:42 PM Performed by: Marily Memos, Sherika Kubicki J Authorized by: Marily Memos, Key Cen J Consent: Verbal consent obtained. Risks and benefits: risks, benefits and alternatives were discussed Consent given by: patient Patient identity confirmed: verbally with patient Location: Right index finger, dorsal surface. Laceration length: 2.1 cm Tendon involvement: none Nerve involvement: none Vascular damage: no Anesthesia: digital block Local anesthetic: lidocaine 2% without epinephrine Anesthetic total: 4 ml Patient sedated: no Irrigation solution: Iodine and normal saline and 4 x 4 gauze with significant scrubbing to clear away dead tissue. Amount of cleaning: standard Debridement: minimal Degree of undermining: none Skin closure: glue Dressing: splint   (including critical care time) Labs Review Labs Reviewed  POCT I-STAT, CHEM 8 - Abnormal; Notable for the following:    Calcium, Ion 1.10 (*)    Hemoglobin 16.7 (*)    HCT 49.0 (*)    All other components within normal limits  URIC ACID    Imaging Review Dg Knee 2 Views Right  12/27/2014   CLINICAL DATA:  Pain and swelling.  No known injury  EXAM: RIGHT KNEE - 1-2 VIEW  COMPARISON:  None.  FINDINGS: Standing frontal and lateral views were obtained. No fracture or dislocation. No appreciable joint effusion. Joint spaces appear intact. No erosive change.  IMPRESSION: No appreciable arthropathy.  No  fracture or effusion.   Electronically Signed   By: Lowella Grip III M.D.   On: 12/27/2014 15:14     MDM   1. Laceration   2. Knee swelling, right    The pain likely from gout. Plain film without evidence of arthritis or acute process. Chem-8 showing normal renal function. Will treat with 1 dose of Decadron here in clinic. Start indomethacin.  Laceration. Repaired as above. Placed a finger splint for 24 hours. No need for antibiotic treatment at this time. Cautions given  Linna Darner, MD Family Medicine 12/27/2014, 3:41 PM      Waldemar Dickens, MD 12/27/14 [phone]

## 2014-12-27 NOTE — ED Notes (Signed)
C/o  Right knee swelling with radiating pain down leg x 4 days.  No known injury.   Pt also reports laceration to the right ring finger yesterday evening while cutting an onion.  Pt states that she has had a tetanus shot within the past year and a half.

## 2016-08-30 ENCOUNTER — Encounter (HOSPITAL_COMMUNITY): Payer: Self-pay | Admitting: Emergency Medicine

## 2016-08-30 ENCOUNTER — Emergency Department (HOSPITAL_COMMUNITY)
Admission: EM | Admit: 2016-08-30 | Discharge: 2016-08-30 | Disposition: A | Discharge status: Home / Self Care | Payer: Self-pay | Attending: Emergency Medicine | Admitting: Emergency Medicine

## 2016-08-30 DIAGNOSIS — F17210 Nicotine dependence, cigarettes, uncomplicated: Secondary | ICD-10-CM | POA: Insufficient documentation

## 2016-08-30 DIAGNOSIS — M79642 Pain in left hand: Secondary | ICD-10-CM | POA: Insufficient documentation

## 2016-08-30 DIAGNOSIS — M79604 Pain in right leg: Secondary | ICD-10-CM

## 2016-08-30 DIAGNOSIS — M792 Neuralgia and neuritis, unspecified: Secondary | ICD-10-CM | POA: Insufficient documentation

## 2016-08-30 NOTE — Discharge Instructions (Signed)
Stay warm! Use heat or Ice and elevate your leg and hand throughout the day, using ice pack or heating pad for no more than 20 minutes every hour.  Alternate between tylenol and motrin as needed for pain relief. Follow up with your regular doctor at the [facility], or with [facility] in the next 1-2 weeks for recheck of symptoms and to establish medical care. Return to the ER for changes or worsening symptoms.

## 2016-08-30 NOTE — ED Provider Notes (Signed)
[facility] DEPT Provider Note   CSN: [id number] Arrival date & time: 08/30/16  1347  By signing my name below, I, Traci Buchanan, attest that this documentation has been prepared under the direction and in the presence of Kristianne Albin Camprubi-Soms, PA-C. Electronically Signed: Rayna Buchanan, ED Scribe. 08/30/16. 2:05 PM.   History   Chief Complaint Chief Complaint  Patient presents with  . Leg Pain  . Hand Pain    HPI HPI Comments: Traci Buchanan is a 60 y.o. female who presents to the Emergency Department complaining of constant, 8/10, aching/throbbing/numb-tingly, diffuse nonradiating right lower leg pain x 1 week. Her pain spans from her right foot to her right knee. Her leg pain worsens with ambulation. She has applied rubbing alcohol, taken ibuprofen, and elevated her leg w/o significant relief. Initially she states she's never had issues like this before, but then later she acknowledges that she's had similar pain and was told last year that she had "bad cartilage" in her knees. Reports the leg sometimes feels "asleep", like paresthesias, but that's intermittent. Denies falls or trauma. No inciting incident.  Pt is left hand dominant and also complains of constant, moderate, atraumatic, throbbing, left thumb pain x 2 weeks. She denies any numbness or tingling in the left thumb. Pt denies any injuries to the region.   She denies any LE swelling, skin changes to either location of pain, joint swelling, erythema/warmth of leg/thumb, fevers, chills, CP, SOB, abdominal pain, nausea, vomiting, diarrhea, constipation, dysuria, hematuria, focal weakness, back pain, or other associated symptoms at this time. OF NOTE: pt states she has a safe place to stay, reports staying with her sister "sometimes". When asked if she had a warm safe dry indoor place to stay today, she stated that she does.    The history is provided by the patient and medical records. No language interpreter was used.    Leg Pain   This is a recurrent problem. The current episode started more than 1 week ago. The problem occurs constantly. The problem has not changed since onset.The pain is present in the right lower leg. The quality of the pain is described as aching. The pain is at a severity of 8/10. The pain is moderate. Associated symptoms include numbness (paresthesias) and tingling. Pertinent negatives include full range of motion. The symptoms are aggravated by activity. She has tried OTC pain medications for the symptoms. The treatment provided no relief. There has been no history of extremity trauma.  Hand Pain  Pertinent negatives include no chest pain, no abdominal pain and no shortness of breath.    Past Medical History:  Diagnosis Date  . Elbow fracture, left     There are no active problems to display for this patient.   No past surgical history on file.  OB History    No data available       Home Medications    Prior to Admission medications   Medication Sig Start Date End Date Taking? Authorizing Provider  indomethacin (INDOCIN) 50 MG capsule Take 1 capsule (50 mg total) by mouth 3 (three) times daily with meals. 12/27/14   Waldemar Dickens, MD    Family History Family History  Problem Relation Age of Onset  . Diabetes Mother     Social History Social History  Substance Use Topics  . Smoking status: Current Every Day Smoker    Packs/day: 0.50    Types: Cigarettes  . Smokeless tobacco: Not on file  . Alcohol use Yes  Allergies   Patient has no known allergies.   Review of Systems Review of Systems  Constitutional: Negative for chills and fever.  Respiratory: Negative for shortness of breath.   Cardiovascular: Negative for chest pain and leg swelling.  Gastrointestinal: Negative for abdominal pain, constipation, diarrhea, nausea and vomiting.  Genitourinary: Negative for dysuria and hematuria.  Musculoskeletal: Positive for arthralgias and myalgias. Negative  for back pain.  Skin: Negative for color change and wound.  Allergic/Immunologic: Negative for immunocompromised state.  Neurological: Positive for tingling and numbness (paresthesias). Negative for weakness.  Psychiatric/Behavioral: Negative for confusion.  A complete 10 system review of systems was obtained and all systems are negative except as noted in the HPI and PMH.   Physical Exam Updated Vital Signs BP 145/90 (BP Location: Right Arm)   Pulse 85   Temp 97.7 F (36.5 C) (Oral)   Resp 18   Ht 5\' 5"  (1.651 m)   Wt 125 lb (56.7 kg)   SpO2 100%   BMI 20.80 kg/m   Physical Exam  Constitutional: She is oriented to person, place, and time. Vital signs are normal. She appears well-developed and well-nourished.  Non-toxic appearance. No distress.  Afebrile, nontoxic, NAD  HENT:  Head: Normocephalic and atraumatic.  Mouth/Throat: Mucous membranes are normal.  Eyes: Conjunctivae and EOM are normal. Right eye exhibits no discharge. Left eye exhibits no discharge.  Neck: Normal range of motion. Neck supple.  Cardiovascular: Normal rate and intact distal pulses.   Pulmonary/Chest: Effort normal. No respiratory distress.  Abdominal: Normal appearance. She exhibits no distension.  Musculoskeletal: Normal range of motion.       Right lower leg: She exhibits tenderness. She exhibits no bony tenderness, no swelling, no edema, no deformity and no laceration.  Right lower leg with diffuse TTP w/o any focal bony or specific area of tenderness, negative Homan's sign, FROM intact in all joints, w/o crepitus or deformity, no erythema or warmth, no skin changes, no pedal edema or swelling, strength and sensation grossly intact, distal pulses intact, compartments soft. Left thumb with mild TTP to the [facility] joint, remainder of wrist and hand non tender, no swelling or erythema, no warmth or bruising, FROM intact.   Neurological: She is alert and oriented to person, place, and time. She has normal  strength. No sensory deficit.  Skin: Skin is warm, dry and intact. No rash noted.  Psychiatric: She has a normal mood and affect. Her behavior is normal.  Nursing note and vitals reviewed.  ED Treatments / Results  Labs (all labs ordered are listed, but only abnormal results are displayed) Labs Reviewed - No data to display  EKG  EKG Interpretation None       Radiology No results found.  Procedures Procedures  DIAGNOSTIC STUDIES: Oxygen Saturation is 100% on RA, normal by my interpretation.    COORDINATION OF CARE: 2:05 PM Discussed next steps with pt. Pt verbalized understanding and is agreeable with the plan.    Medications Ordered in ED Medications - No data to display   Initial Impression / Assessment and Plan / ED Course  I have reviewed the triage vital signs and the nursing notes.  Pertinent labs & imaging results that were available during my care of the patient were reviewed by me and considered in my medical decision making (see chart for details).  Clinical Course     60 y.o. female here with atraumatic R leg pain and L thumb pain. Vague about story and when symptoms started,  later states that she's had issues before like this. Chart review reveals she's been seen in the last year for similar issues. On exam, no focal tenderness on R leg, just diffuse tenderness all over; no swelling to indicate DVT, good pulses and NVI with soft compartments. Doubt emergent issue. L thumb CMC joint mildly tender but otherwise unremarkable. Possibly arthritic since pt is L handed; doubt need for emergent work up. Discussed ice/heat, RICE, and tylenol/motrin, and f/up with PCP or [facility] to establish care. OF NOTE: Pt appears possibly homeless, although she states she has a place to stay "from time to time"; acknowledges that she has a safe warm indoor place to stay today. I explained the diagnosis and have given explicit precautions to return to the ER including for any other new or  worsening symptoms. The patient understands and accepts the medical plan as it's been dictated and I have answered their questions. Discharge instructions concerning home care and prescriptions have been given. The patient is STABLE and is discharged to home in good condition.   I personally performed the services described in this documentation, which was scribed in my presence. The recorded information has been reviewed and is accurate.   Final Clinical Impressions(s) / ED Diagnoses   Final diagnoses:  Right leg pain  Neuropathic pain  Left hand pain    New Prescriptions New Prescriptions   No medications on file     Joya Willmott Camprubi-Soms, PA-C 08/30/16 1421    Varney Biles, MD 09/01/16 1548

## 2016-08-30 NOTE — ED Notes (Signed)
Patient is alert and oriented x3.  She was given DC instructions and follow up visit instructions.  Patient gave verbal understanding. She was DC ambulatory under her own power to home.  V/S stable.  He was not showing any signs of distress on DC 

## 2016-08-30 NOTE — ED Triage Notes (Signed)
Patient reports right leg pain x4 days. Patient also reports left thumb pain x2 weeks. Denies injury. Ambulatory to triage without difficulty.

## 2016-09-15 ENCOUNTER — Emergency Department (HOSPITAL_COMMUNITY): Payer: Self-pay

## 2016-09-15 ENCOUNTER — Emergency Department (HOSPITAL_COMMUNITY)
Admission: EM | Admit: 2016-09-15 | Discharge: 2016-09-15 | Disposition: A | Discharge status: Home / Self Care | Payer: Self-pay | Attending: Emergency Medicine | Admitting: Emergency Medicine

## 2016-09-15 ENCOUNTER — Encounter (HOSPITAL_COMMUNITY): Payer: Self-pay | Admitting: Emergency Medicine

## 2016-09-15 DIAGNOSIS — Z79899 Other long term (current) drug therapy: Secondary | ICD-10-CM | POA: Insufficient documentation

## 2016-09-15 DIAGNOSIS — J40 Bronchitis, not specified as acute or chronic: Secondary | ICD-10-CM | POA: Insufficient documentation

## 2016-09-15 DIAGNOSIS — F17210 Nicotine dependence, cigarettes, uncomplicated: Secondary | ICD-10-CM | POA: Insufficient documentation

## 2016-09-15 LAB — COMPREHENSIVE METABOLIC PANEL
ALK PHOS: 65 U/L (ref 38–126)
ALT: 15 U/L (ref 14–54)
AST: 25 U/L (ref 15–41)
Albumin: 4.1 g/dL (ref 3.5–5.0)
Anion gap: 8 (ref 5–15)
BUN: 10 mg/dL (ref 6–20)
CO2: 24 mmol/L (ref 22–32)
CREATININE: 1.22 mg/dL — AB (ref 0.44–1.00)
Calcium: 9.3 mg/dL (ref 8.9–10.3)
Chloride: 106 mmol/L (ref 101–111)
GFR calc non Af Amer: 48 mL/min — ABNORMAL LOW (ref >60)
GFR, EST AFRICAN AMERICAN: 55 mL/min — AB (ref >60)
Glucose, Bld: 102 mg/dL — ABNORMAL HIGH (ref 65–99)
Potassium: 4 mmol/L (ref 3.5–5.1)
SODIUM: 138 mmol/L (ref 135–145)
Total Bilirubin: 0.5 mg/dL (ref 0.3–1.2)
Total Protein: 7.3 g/dL (ref 6.5–8.1)

## 2016-09-15 LAB — CBC WITH DIFFERENTIAL/PLATELET
Basophils Absolute: 0.1 10*3/uL (ref 0.0–0.1)
Basophils Relative: 1 %
EOS ABS: 0.1 10*3/uL (ref 0.0–0.7)
Eosinophils Relative: 1 %
HCT: 41.3 % (ref 36.0–46.0)
HEMOGLOBIN: 13.8 g/dL (ref 12.0–15.0)
LYMPHS PCT: 20 %
Lymphs Abs: 1.8 10*3/uL (ref 0.7–4.0)
MCH: 32.9 pg (ref 26.0–34.0)
MCHC: 33.4 g/dL (ref 30.0–36.0)
MCV: 98.3 fL (ref 78.0–100.0)
Monocytes Absolute: 0.5 10*3/uL (ref 0.1–1.0)
Monocytes Relative: 6 %
NEUTROS PCT: 72 %
Neutro Abs: 6.4 10*3/uL (ref 1.7–7.7)
Platelets: 323 10*3/uL (ref 150–400)
RBC: 4.2 MIL/uL (ref 3.87–5.11)
RDW: 14.2 % (ref 11.5–15.5)
WBC: 8.8 10*3/uL (ref 4.0–10.5)

## 2016-09-15 LAB — URINALYSIS, ROUTINE W REFLEX MICROSCOPIC
Bacteria, UA: NONE SEEN
Bilirubin Urine: NEGATIVE
Glucose, UA: NEGATIVE mg/dL
Ketones, ur: NEGATIVE mg/dL
NITRITE: NEGATIVE
Protein, ur: 100 mg/dL — AB
Specific Gravity, Urine: 1.021 (ref 1.005–1.030)
pH: 5 (ref 5.0–8.0)

## 2016-09-15 LAB — I-STAT TROPONIN, ED: TROPONIN I, POC: 0 ng/mL (ref 0.00–0.08)

## 2016-09-15 LAB — I-STAT CG4 LACTIC ACID, ED: Lactic Acid, Venous: 0.58 mmol/L (ref 0.5–1.9)

## 2016-09-15 MED ORDER — KETOROLAC TROMETHAMINE 30 MG/ML IJ SOLN
15.0000 mg | Freq: Once | INTRAMUSCULAR | Status: AC
  Administered 2016-09-15: 15 mg via INTRAVENOUS
  Filled 2016-09-15: qty 1

## 2016-09-15 MED ORDER — ACETAMINOPHEN 325 MG PO TABS
650.0000 mg | ORAL_TABLET | Freq: Once | ORAL | Status: AC
  Administered 2016-09-15: 650 mg via ORAL
  Filled 2016-09-15: qty 2

## 2016-09-15 MED ORDER — SODIUM CHLORIDE 0.9 % IV BOLUS (SEPSIS)
1000.0000 mL | Freq: Once | INTRAVENOUS | Status: AC
Start: 2016-09-15 — End: 2016-09-15
  Administered 2016-09-15: 1000 mL via INTRAVENOUS

## 2016-09-15 MED ORDER — DIPHENHYDRAMINE HCL 50 MG/ML IJ SOLN
12.5000 mg | Freq: Once | INTRAMUSCULAR | Status: AC
  Administered 2016-09-15: 12.5 mg via INTRAVENOUS
  Filled 2016-09-15: qty 1

## 2016-09-15 MED ORDER — AZITHROMYCIN 250 MG PO TABS: ORAL_TABLET | ORAL | 0 refills | Status: DC

## 2016-09-15 MED ORDER — METOCLOPRAMIDE HCL 5 MG/ML IJ SOLN
10.0000 mg | Freq: Once | INTRAMUSCULAR | Status: AC
  Administered 2016-09-15: 10 mg via INTRAVENOUS
  Filled 2016-09-15: qty 2

## 2016-09-15 NOTE — Discharge Instructions (Signed)
Take zpack as prescribed.   Stop smoking.   Take tylenol, motrin for headaches, fever.   See your doctor  Return to ER if you have severe headaches, back pain, fever, cough

## 2016-09-15 NOTE — ED Notes (Signed)
Pt states she understands instructions and will follow up as directed. Home stable with steady gait. All questions and concerns addressed.

## 2016-09-15 NOTE — ED Notes (Signed)
Placed patient into a gown and on the monitor waiting for provider 

## 2016-09-15 NOTE — ED Notes (Signed)
Walked patient to the bathroom patient did well 

## 2016-09-15 NOTE — ED Provider Notes (Signed)
[facility] DEPT Provider Note   CSN: [id number] Arrival date & time: 09/15/16  0945     History   Chief Complaint Chief Complaint  Patient presents with  . Headache    HPI Traci Buchanan is a 60 y.o. female of healthy presenting with cough, myalgias, headaches. Patient states that he has been having productive cough for the last 3-4 days. Patient has subjective chills as well as body aches. Denies any abdominal pain or vomiting. This morning she woke up with some headaches and blurry vision. Denies any fevers today. Denies any urinary symptoms.  The history is provided by the patient.    Past Medical History:  Diagnosis Date  . Elbow fracture, left     There are no active problems to display for this patient.   History reviewed. No pertinent surgical history.  OB History    No data available       Home Medications    Prior to Admission medications   Medication Sig Start Date End Date Taking? Authorizing Provider  indomethacin (INDOCIN) 50 MG capsule Take 1 capsule (50 mg total) by mouth 3 (three) times daily with meals. Patient not taking: Reported on 09/15/2016 12/27/14   Waldemar Dickens, MD    Family History Family History  Problem Relation Age of Onset  . Diabetes Mother     Social History Social History  Substance Use Topics  . Smoking status: Current Every Day Smoker    Packs/day: 0.50    Types: Cigarettes  . Smokeless tobacco: Not on file  . Alcohol use Yes     Allergies   Patient has no known allergies.   Review of Systems Review of Systems  Constitutional: Positive for chills.  Respiratory: Positive for cough.   Neurological: Positive for headaches.  All other systems reviewed and are negative.    Physical Exam Updated Vital Signs BP 153/100   Pulse 88   Temp 98.1 F (36.7 C) (Oral)   Resp 16   SpO2 95%   Physical Exam  Constitutional:  Uncomfortable, dehydrated   HENT:  Head: Normocephalic.  Mm slightly dry     Eyes: EOM are normal. Pupils are equal, round, and reactive to light.  Neck: Normal range of motion. Neck supple.  Cardiovascular: Normal rate, regular rhythm and normal heart sounds.   Pulmonary/Chest: Effort normal.  Crackles L base   Abdominal: Soft. Bowel sounds are normal. She exhibits no distension. There is no tenderness. There is no guarding.  Musculoskeletal: Normal range of motion.  Neurological: She is alert. No cranial nerve deficit. Coordination normal.  Skin: Skin is warm.  Psychiatric: She has a normal mood and affect.  Nursing note and vitals reviewed.    ED Treatments / Results  Labs (all labs ordered are listed, but only abnormal results are displayed) Labs Reviewed  COMPREHENSIVE METABOLIC PANEL - Abnormal; Notable for the following:       Result Value   Glucose, Bld 102 (*)    Creatinine, Ser 1.22 (*)    GFR calc non Af Amer 48 (*)    GFR calc Af Amer 55 (*)    All other components within normal limits  URINALYSIS, ROUTINE W REFLEX MICROSCOPIC - Abnormal; Notable for the following:    Hgb urine dipstick SMALL (*)    Protein, ur 100 (*)    Leukocytes, UA TRACE (*)    Squamous Epithelial / LPF 0-5 (*)    All other components within normal limits  CBC WITH  DIFFERENTIAL/PLATELET  Randolm Idol, ED  I-STAT CG4 LACTIC ACID, ED    EKG  EKG Interpretation None       Radiology Dg Chest 2 View  Result Date: 09/15/2016 CLINICAL DATA:  Productive cough since yesterday. EXAM: CHEST  2 VIEW COMPARISON:  PA and lateral chest 11/09/2009. FINDINGS: Lungs clear. Heart size normal. No pneumothorax or pleural effusion. No focal bony abnormality. IMPRESSION: No acute disease. Electronically Signed   By: Inge Rise M.D.   On: 09/15/2016 12:28    Procedures Procedures (including critical care time)  Medications Ordered in ED Medications  sodium chloride 0.9 % bolus 1,000 mL (1,000 mLs Intravenous New Bag/Given 09/15/16 1145)  metoCLOPramide (REGLAN)  injection 10 mg (10 mg Intravenous Given 09/15/16 1145)  diphenhydrAMINE (BENADRYL) injection 12.5 mg (12.5 mg Intravenous Given 09/15/16 1145)  ketorolac (TORADOL) 30 MG/ML injection 15 mg (15 mg Intravenous Given 09/15/16 1302)     Initial Impression / Assessment and Plan / ED Course  I have reviewed the triage vital signs and the nursing notes.  Pertinent labs & imaging results that were available during my care of the patient were reviewed by me and considered in my medical decision making (see chart for details).  Clinical Course     Traci Buchanan is a 60 y.o. female here with cough, chills, body aches, headaches. Likely pneumonia vs viral syndrome vs UTI. Will get labs, CXR, UA. Will hydrate and reassess.   1:45 PM Felt better after migraine cocktail. CXR clear, UA showed no obvious UTI (small leuks but nl WBC, contaminated). WBC nl. Vitals stable. Will dc home with zpack as needed for possible bronchitis.    Final Clinical Impressions(s) / ED Diagnoses   Final diagnoses:  None    New Prescriptions New Prescriptions   No medications on file     Drenda Freeze, MD 09/15/16 1348

## 2016-09-15 NOTE — ED Triage Notes (Signed)
Pt sts HA and body aches x 2 days

## 2016-09-16 LAB — URINE CULTURE

## 2017-11-23 ENCOUNTER — Emergency Department (HOSPITAL_COMMUNITY)
Admission: EM | Admit: 2017-11-23 | Discharge: 2017-11-23 | Disposition: A | Discharge status: Home / Self Care | Payer: Self-pay | Attending: Emergency Medicine | Admitting: Emergency Medicine

## 2017-11-23 ENCOUNTER — Encounter (HOSPITAL_COMMUNITY): Payer: Self-pay | Admitting: *Deleted

## 2017-11-23 ENCOUNTER — Emergency Department (HOSPITAL_COMMUNITY): Payer: Self-pay

## 2017-11-23 DIAGNOSIS — E876 Hypokalemia: Secondary | ICD-10-CM | POA: Insufficient documentation

## 2017-11-23 DIAGNOSIS — R52 Pain, unspecified: Secondary | ICD-10-CM

## 2017-11-23 DIAGNOSIS — R1030 Lower abdominal pain, unspecified: Secondary | ICD-10-CM

## 2017-11-23 DIAGNOSIS — R197 Diarrhea, unspecified: Secondary | ICD-10-CM | POA: Insufficient documentation

## 2017-11-23 DIAGNOSIS — R05 Cough: Secondary | ICD-10-CM | POA: Insufficient documentation

## 2017-11-23 DIAGNOSIS — R059 Cough, unspecified: Secondary | ICD-10-CM

## 2017-11-23 DIAGNOSIS — N390 Urinary tract infection, site not specified: Secondary | ICD-10-CM

## 2017-11-23 DIAGNOSIS — R112 Nausea with vomiting, unspecified: Secondary | ICD-10-CM | POA: Insufficient documentation

## 2017-11-23 DIAGNOSIS — M791 Myalgia, unspecified site: Secondary | ICD-10-CM | POA: Insufficient documentation

## 2017-11-23 DIAGNOSIS — Z720 Tobacco use: Secondary | ICD-10-CM

## 2017-11-23 DIAGNOSIS — D473 Essential (hemorrhagic) thrombocythemia: Secondary | ICD-10-CM | POA: Insufficient documentation

## 2017-11-23 DIAGNOSIS — J069 Acute upper respiratory infection, unspecified: Secondary | ICD-10-CM | POA: Insufficient documentation

## 2017-11-23 DIAGNOSIS — Z79899 Other long term (current) drug therapy: Secondary | ICD-10-CM | POA: Insufficient documentation

## 2017-11-23 DIAGNOSIS — D75839 Thrombocytosis, unspecified: Secondary | ICD-10-CM

## 2017-11-23 DIAGNOSIS — F17210 Nicotine dependence, cigarettes, uncomplicated: Secondary | ICD-10-CM | POA: Insufficient documentation

## 2017-11-23 LAB — URINALYSIS, ROUTINE W REFLEX MICROSCOPIC
BILIRUBIN URINE: NEGATIVE
Glucose, UA: NEGATIVE mg/dL
Ketones, ur: NEGATIVE mg/dL
Nitrite: NEGATIVE
Protein, ur: NEGATIVE mg/dL
Specific Gravity, Urine: 1.019 (ref 1.005–1.030)
pH: 5 (ref 5.0–8.0)

## 2017-11-23 LAB — I-STAT BETA HCG BLOOD, ED (MC, WL, AP ONLY): I-stat hCG, quantitative: <5 m[IU]/mL (ref <5)

## 2017-11-23 LAB — COMPREHENSIVE METABOLIC PANEL
ALBUMIN: 3.6 g/dL (ref 3.5–5.0)
ALT: 17 U/L (ref 14–54)
AST: 26 U/L (ref 15–41)
Alkaline Phosphatase: 79 U/L (ref 38–126)
Anion gap: 8 (ref 5–15)
BUN: 15 mg/dL (ref 6–20)
CALCIUM: 9.1 mg/dL (ref 8.9–10.3)
CO2: 32 mmol/L (ref 22–32)
CREATININE: 1.1 mg/dL — AB (ref 0.44–1.00)
Chloride: 102 mmol/L (ref 101–111)
GFR calc Af Amer: >60 mL/min (ref >60)
GFR calc non Af Amer: 54 mL/min — ABNORMAL LOW (ref >60)
Glucose, Bld: 115 mg/dL — ABNORMAL HIGH (ref 65–99)
Potassium: 3.1 mmol/L — ABNORMAL LOW (ref 3.5–5.1)
SODIUM: 142 mmol/L (ref 135–145)
Total Bilirubin: 0.4 mg/dL (ref 0.3–1.2)
Total Protein: 7.8 g/dL (ref 6.5–8.1)

## 2017-11-23 LAB — CBC
HCT: 42.5 % (ref 36.0–46.0)
Hemoglobin: 14.2 g/dL (ref 12.0–15.0)
MCH: 33.2 pg (ref 26.0–34.0)
MCHC: 33.4 g/dL (ref 30.0–36.0)
MCV: 99.3 fL (ref 78.0–100.0)
PLATELETS: 522 10*3/uL — AB (ref 150–400)
RBC: 4.28 MIL/uL (ref 3.87–5.11)
RDW: 14.4 % (ref 11.5–15.5)
WBC: 7.1 10*3/uL (ref 4.0–10.5)

## 2017-11-23 LAB — LIPASE, BLOOD: Lipase: 36 U/L (ref 11–51)

## 2017-11-23 MED ORDER — SODIUM CHLORIDE 0.9 % IV BOLUS (SEPSIS)
1000.0000 mL | Freq: Once | INTRAVENOUS | Status: AC
  Administered 2017-11-23: 1000 mL via INTRAVENOUS

## 2017-11-23 MED ORDER — ALBUTEROL SULFATE HFA 108 (90 BASE) MCG/ACT IN AERS: 1.0000 | INHALATION_SPRAY | RESPIRATORY_TRACT | 0 refills | Status: AC | PRN

## 2017-11-23 MED ORDER — ONDANSETRON HCL 4 MG/2ML IJ SOLN
4.0000 mg | Freq: Once | INTRAMUSCULAR | Status: AC
  Administered 2017-11-23: 4 mg via INTRAVENOUS
  Filled 2017-11-23: qty 2

## 2017-11-23 MED ORDER — ALBUTEROL SULFATE (2.5 MG/3ML) 0.083% IN NEBU
5.0000 mg | INHALATION_SOLUTION | Freq: Once | RESPIRATORY_TRACT | Status: AC
  Administered 2017-11-23: 5 mg via RESPIRATORY_TRACT
  Filled 2017-11-23: qty 6

## 2017-11-23 MED ORDER — CEPHALEXIN 500 MG PO CAPS: 1000.0000 mg | ORAL_CAPSULE | Freq: Two times a day (BID) | ORAL | 0 refills | Status: AC

## 2017-11-23 MED ORDER — POTASSIUM CHLORIDE CRYS ER 20 MEQ PO TBCR
60.0000 meq | EXTENDED_RELEASE_TABLET | Freq: Once | ORAL | Status: AC
  Administered 2017-11-23: 60 meq via ORAL
  Filled 2017-11-23: qty 3

## 2017-11-23 MED ORDER — ONDANSETRON 4 MG PO TBDP: 4.0000 mg | ORAL_TABLET | Freq: Three times a day (TID) | ORAL | 0 refills | Status: DC | PRN

## 2017-11-23 MED ORDER — ALBUTEROL SULFATE HFA 108 (90 BASE) MCG/ACT IN AERS
2.0000 | INHALATION_SPRAY | Freq: Once | RESPIRATORY_TRACT | Status: AC
  Administered 2017-11-23: 2 via RESPIRATORY_TRACT
  Filled 2017-11-23: qty 6.7

## 2017-11-23 MED ORDER — IPRATROPIUM BROMIDE 0.02 % IN SOLN
0.5000 mg | Freq: Once | RESPIRATORY_TRACT | Status: AC
  Administered 2017-11-23: 0.5 mg via RESPIRATORY_TRACT
  Filled 2017-11-23: qty 2.5

## 2017-11-23 NOTE — Discharge Instructions (Addendum)
Your work up today has been reassuring, overall your symptoms are likely from a viral illness. Your urine sample shows a possible urinary tract infection, take antibiotic until completed.   Use zofran as prescribed, as needed for nausea. May consider using over the counter tums, maalox, pepto bismol, or other over the counter remedies to help with symptoms. Stay well hydrated with small sips of fluids throughout the day. Follow a BRAT (banana-rice-applesauce-toast) diet as described below for the next 24-48 hours. The 'BRAT' diet is suggested, then progress to diet as tolerated as symptoms abate. Call your regular doctor if bloody stools, persistent diarrhea, vomiting, fever or abdominal pain.     Gargle warm salt water and spit it out and use chloraseptic spray as needed for sore throat. Continue to alternate between Tylenol and Ibuprofen for pain or fever. Use Mucinex for cough suppression/expectoration of mucus. Use netipot and flonase to help with nasal congestion. May consider over-the-counter Benadryl or other antihistamine to decrease secretions and for help with your symptoms. Use inhaler as directed, as needed for cough/chest congestion/wheezing/shortness of breath. STOP SMOKING! Follow up with your primary care doctor in 5-7 days for recheck of ongoing symptoms. Return to emergency department for emergent changing or worsening of symptoms.

## 2017-11-23 NOTE — ED Provider Notes (Signed)
[facility] DEPT Provider Note   CSN: [id number] Arrival date & time: 11/23/17  1646     History   Chief Complaint Chief Complaint  Patient presents with  . Abdominal Pain  . Nasal Congestion    HPI Traci Buchanan is a 62 y.o. female with no reported PMHx, who presents to the ED with complaints of URI symptoms x 3-4 days.  Patient states that she has had a cough with yellow sputum production, body aches, rhinorrhea, congestion, and chills for the last several days, she thinks that she is coming down with a cold.  She has also been having 7/10 intermittent nonradiating aching suprapubic abdominal pain with no known aggravating factors and relieved with Alka-Seltzer and NyQuil.  She reports additional associated symptoms of nausea, one episode of nonbloody nonbilious emesis yesterday but none today, and 2 episodes of nonbloody diarrhea today.  Her neighbor was recently sick with similar symptoms.  She admits to being a smoker.  She also admits to drinking a couple beers daily.  She denies any fevers, sore throat, ear pain or drainage, chest pain, shortness of breath, wheezing, hematemesis, melena, hematochezia, constipation, obstipation, dysuria, hematuria, vaginal bleeding or discharge, numbness, tingling, focal weakness, or any other complaints at this time.  She denies recent travel, suspicious food intake, frequent NSAID use, or recent antibiotics.   The history is provided by the patient and medical records. No language interpreter was used.  Abdominal Pain   This is a new problem. The current episode started more than 2 days ago. The problem occurs daily. The problem has not changed since onset.The pain is associated with an unknown factor. The pain is located in the suprapubic region. The quality of the pain is aching. The pain is at a severity of 7/10. The pain is moderate. Associated symptoms include diarrhea, nausea, vomiting and myalgias (body aches).  Pertinent negatives include fever, flatus, hematochezia, melena, constipation, dysuria, hematuria and arthralgias. Nothing aggravates the symptoms. The symptoms are relieved by OTC medications.    Past Medical History:  Diagnosis Date  . Elbow fracture, left     There are no active problems to display for this patient.   History reviewed. No pertinent surgical history.  OB History    No data available       Home Medications    Prior to Admission medications   Medication Sig Start Date End Date Taking? Authorizing Provider  azithromycin (ZITHROMAX Z-PAK) 250 MG tablet 2 po day one, then 1 daily x 4 days 09/15/16   Drenda Freeze, MD  indomethacin (INDOCIN) 50 MG capsule Take 1 capsule (50 mg total) by mouth 3 (three) times daily with meals. Patient not taking: Reported on 09/15/2016 12/27/14   Waldemar Dickens, MD    Family History Family History  Problem Relation Age of Onset  . Diabetes Mother     Social History Social History   Tobacco Use  . Smoking status: Current Every Day Smoker    Packs/day: 0.50    Types: Cigarettes  . Smokeless tobacco: Never Used  Substance Use Topics  . Alcohol use: Yes  . Drug use: No     Allergies   Patient has no known allergies.   Review of Systems Review of Systems  Constitutional: Positive for chills. Negative for fever.  HENT: Positive for congestion and rhinorrhea. Negative for ear discharge, ear pain and sore throat.   Respiratory: Positive for cough. Negative for shortness of breath and wheezing.  Cardiovascular: Negative for chest pain.  Gastrointestinal: Positive for abdominal pain, diarrhea, nausea and vomiting. Negative for blood in stool, constipation, flatus, hematochezia and melena.  Genitourinary: Negative for dysuria, hematuria, vaginal bleeding and vaginal discharge.  Musculoskeletal: Positive for myalgias (body aches). Negative for arthralgias.  Skin: Negative for color change.  Allergic/Immunologic:  Negative for immunocompromised state.  Neurological: Negative for weakness and numbness.  Psychiatric/Behavioral: Negative for confusion.   All other systems reviewed and are negative for acute change except as noted in the HPI.    Physical Exam Updated Vital Signs BP 124/88 (BP Location: Right Arm)   Pulse 88   Temp 98.1 F (36.7 C) (Oral)   Resp 18   SpO2 91%   Physical Exam  Constitutional: She is oriented to person, place, and time. Vital signs are normal. She appears well-developed and well-nourished.  Non-toxic appearance. No distress.  Afebrile, nontoxic, NAD  HENT:  Head: Normocephalic and atraumatic.  Nose: Mucosal edema and rhinorrhea present.  Mouth/Throat: Uvula is midline, oropharynx is clear and moist and mucous membranes are normal. No trismus in the jaw. No uvula swelling. Tonsils are 0 on the right. Tonsils are 0 on the left. No tonsillar exudate.  Nose mildly congested with clear rhinorrhea. Oropharynx clear and moist, without uvular swelling or deviation, no trismus or drooling, no tonsillar swelling or erythema, no exudates.    Eyes: Conjunctivae and EOM are normal. Right eye exhibits no discharge. Left eye exhibits no discharge.  Neck: Normal range of motion. Neck supple.  Cardiovascular: Normal rate, regular rhythm, normal heart sounds and intact distal pulses. Exam reveals no gallop and no friction rub.  No murmur heard. Pulmonary/Chest: Effort normal. No respiratory distress. She has no decreased breath sounds. She has wheezes. She has no rhonchi. She has no rales.  Faint expiratory wheezing in upper fields, otherwise CTAB in all other lung fields, no rhonchi/rales, no hypoxia or increased WOB, speaking in full sentences, SpO2 91% on RA   Abdominal: Soft. Normal appearance and bowel sounds are normal. She exhibits no distension. There is no tenderness. There is no rigidity, no rebound, no guarding, no CVA tenderness, no tenderness at McBurney's point and negative  Murphy's sign.  Soft, NTND, +BS throughout, no r/g/r, neg murphy's, neg mcburney's, no CVA TTP   Musculoskeletal: Normal range of motion.  Neurological: She is alert and oriented to person, place, and time. She has normal strength. No sensory deficit.  Skin: Skin is warm, dry and intact. No rash noted.  Psychiatric: She has a normal mood and affect.  Nursing note and vitals reviewed.    ED Treatments / Results  Labs (all labs ordered are listed, but only abnormal results are displayed) Labs Reviewed  COMPREHENSIVE METABOLIC PANEL - Abnormal; Notable for the following components:      Result Value   Potassium 3.1 (*)    Glucose, Bld 115 (*)    Creatinine, Ser 1.10 (*)    GFR calc non Af Amer 54 (*)    All other components within normal limits  CBC - Abnormal; Notable for the following components:   Platelets 522 (*)    All other components within normal limits  URINALYSIS, ROUTINE W REFLEX MICROSCOPIC - Abnormal; Notable for the following components:   APPearance HAZY (*)    Hgb urine dipstick MODERATE (*)    Leukocytes, UA SMALL (*)    Bacteria, UA MANY (*)    Squamous Epithelial / LPF 0-5 (*)    All other components  within normal limits  URINE CULTURE  LIPASE, BLOOD  I-STAT BETA HCG BLOOD, ED ([facility], [facility], [facility] ONLY)    EKG  EKG Interpretation None       Radiology Dg Chest 2 View  Result Date: 11/23/2017 CLINICAL DATA:  Productive cough and congestion. EXAM: CHEST  2 VIEW COMPARISON:  Chest radiograph 09/15/2016. FINDINGS: Normal cardiac and mediastinal contours. No consolidative pulmonary opacities. No pleural effusion or pneumothorax. Regional skeleton is unremarkable. IMPRESSION: No acute cardiopulmonary process. Electronically Signed   By: Lovey Newcomer M.D.   On: 11/23/2017 20:10    Procedures Procedures (including critical care time)  Medications Ordered in ED Medications  sodium chloride 0.9 % bolus 1,000 mL (0 mLs Intravenous Stopped 11/23/17 2132)  ondansetron  (ZOFRAN) injection 4 mg (4 mg Intravenous Given 11/23/17 2003)  albuterol (PROVENTIL) (2.5 MG/3ML) 0.083% nebulizer solution 5 mg (5 mg Nebulization Given 11/23/17 1957)  ipratropium (ATROVENT) nebulizer solution 0.5 mg (0.5 mg Nebulization Given 11/23/17 1957)  potassium chloride SA (K-DUR,KLOR-CON) CR tablet 60 mEq (60 mEq Oral Given 11/23/17 2133)  albuterol (PROVENTIL HFA;VENTOLIN HFA) 108 (90 Base) MCG/ACT inhaler 2 puff (2 puffs Inhalation Given 11/23/17 2239)     Initial Impression / Assessment and Plan / ED Course  I have reviewed the triage vital signs and the nursing notes.  Pertinent labs & imaging results that were available during my care of the patient were reviewed by me and considered in my medical decision making (see chart for details).     62 y.o. female here with URI symptoms x3-4 days including cough, congestion, lower abd pain, body aches, n/v/d, and chills.  +Sick contacts. On exam, faint expiratory wheeze but no rhonchi/rales, throat clear, nose congested with rhinorrhea, abdomen soft and nontender. Will get labs and CXR, give zofran, fluids, and duoneb, then reassess shortly.   10:40 PM CBC with elevated platelet count of 522, could be acute phase reactant from illness, remainder of CBC WNL. CMP with mild hypokalemia K 3.1, will replete orally; otherwise stable kidney function and no other acute findings on CMP. Lipase WNL. U/A with small leuks, nitrite neg, 0-5 RBCs and WBCs, 0-5 squamous, and many bacteria, with mucus present; could represent vaginal contaminant, however with few squamous and many bacteria it could be representative of UTI; given that she has some suprapubic pain, will cover for UTI, will also send for UCx. BetaHCG neg. CXR negative.  Pt's lung sounds improved after duoneb, pt feeling better and tolerating PO well. Overall most of her symptoms are consistent with viral illness, flu could be possible but pt outside of window where tamiflu would be helpful. Will send  home with albuterol inhaler, doubt need for steroids, advised other OTC remedies for symptomatic relief. Smoking cessation strongly encouraged. Will also rx zofran. Discussed BRAT diet for diarrhea as well as other OTC remedies for this as well. Rx for keflex for UTI given. Advised f/up with PCP in 1wk for recheck of symptoms. I explained the diagnosis and have given explicit precautions to return to the ER including for any other new or worsening symptoms. The patient understands and accepts the medical plan as it's been dictated and I have answered their questions. Discharge instructions concerning home care and prescriptions have been given. The patient is STABLE and is discharged to home in good condition.    Final Clinical Impressions(s) / ED Diagnoses   Final diagnoses:  Upper respiratory tract infection, unspecified type  Cough  Nausea vomiting and diarrhea  Body aches  Lower abdominal pain  Tobacco user  Thrombocytosis (HCC)  Hypokalemia  Acute lower UTI    ED Discharge Orders        Ordered    ondansetron (ZOFRAN ODT) 4 MG disintegrating tablet  Every 8 hours PRN     11/23/17 2149    albuterol (PROVENTIL HFA;VENTOLIN HFA) 108 (90 Base) MCG/ACT inhaler  Every 4 hours PRN     11/23/17 2149    cephALEXin (KEFLEX) 500 MG capsule  2 times daily     11/23/17 [address], [place], [place] 11/23/17 2240    Drenda Freeze, MD 11/23/17 [phone]

## 2017-11-23 NOTE — ED Triage Notes (Signed)
Pt complains of lower abdominal pain, emesis, diarrhea, weakness, congestion for the past 3-4 days.

## 2017-11-26 LAB — URINE CULTURE

## 2017-11-27 ENCOUNTER — Telehealth: Payer: Self-pay | Admitting: *Deleted

## 2017-11-27 NOTE — Telephone Encounter (Signed)
Post ED Visit - Positive Culture Follow-up  Culture report reviewed by antimicrobial stewardship pharmacist:  []  Elenor Quinones, Pharm.D. []  Heide Guile, Pharm.D., BCPS AQ-ID []  Parks Neptune, Pharm.D., BCPS []  Alycia Rossetti, Pharm.D., BCPS []  [place], Pharm.D., BCPS, AAHIVP []  Legrand Como, Pharm.D., BCPS, AAHIVP []  Salome Arnt, PharmD, BCPS []  Jalene Mullet, PharmD []  Vincenza Hews, PharmD, BCPS Leroy Libman, PharmD  Positive urine culture Treated with Cephalexin, organism sensitive to the same and no further patient follow-up is required at this time.  Harlon Flor [facility] 11/27/2017, 10:55 AM

## 2021-04-05 ENCOUNTER — Emergency Department (HOSPITAL_COMMUNITY): Payer: Self-pay

## 2021-04-05 ENCOUNTER — Observation Stay (HOSPITAL_COMMUNITY)
Admission: EM | Admit: 2021-04-05 | Discharge: 2021-04-06 | Disposition: A | Discharge status: Home / Self Care | Payer: Self-pay | Attending: Internal Medicine | Admitting: Internal Medicine

## 2021-04-05 DIAGNOSIS — R680 Hypothermia, not associated with low environmental temperature: Secondary | ICD-10-CM | POA: Insufficient documentation

## 2021-04-05 DIAGNOSIS — E119 Type 2 diabetes mellitus without complications: Secondary | ICD-10-CM | POA: Insufficient documentation

## 2021-04-05 DIAGNOSIS — Z794 Long term (current) use of insulin: Secondary | ICD-10-CM | POA: Insufficient documentation

## 2021-04-05 DIAGNOSIS — N179 Acute kidney failure, unspecified: Secondary | ICD-10-CM | POA: Insufficient documentation

## 2021-04-05 DIAGNOSIS — D72829 Elevated white blood cell count, unspecified: Secondary | ICD-10-CM | POA: Insufficient documentation

## 2021-04-05 DIAGNOSIS — F1910 Other psychoactive substance abuse, uncomplicated: Secondary | ICD-10-CM | POA: Insufficient documentation

## 2021-04-05 DIAGNOSIS — T40601A Poisoning by unspecified narcotics, accidental (unintentional), initial encounter: Secondary | ICD-10-CM

## 2021-04-05 DIAGNOSIS — F17210 Nicotine dependence, cigarettes, uncomplicated: Secondary | ICD-10-CM | POA: Insufficient documentation

## 2021-04-05 DIAGNOSIS — Y900 Blood alcohol level of less than 20 mg/100 ml: Secondary | ICD-10-CM | POA: Insufficient documentation

## 2021-04-05 DIAGNOSIS — T68XXXA Hypothermia, initial encounter: Secondary | ICD-10-CM

## 2021-04-05 DIAGNOSIS — Z20822 Contact with and (suspected) exposure to covid-19: Secondary | ICD-10-CM | POA: Insufficient documentation

## 2021-04-05 DIAGNOSIS — T402X1A Poisoning by other opioids, accidental (unintentional), initial encounter: Principal | ICD-10-CM | POA: Insufficient documentation

## 2021-04-05 LAB — COMPREHENSIVE METABOLIC PANEL
ALT: 19 U/L (ref 0–44)
AST: 28 U/L (ref 15–41)
Albumin: 4 g/dL (ref 3.5–5.0)
Alkaline Phosphatase: 71 U/L (ref 38–126)
Anion gap: 11 (ref 5–15)
BUN: 26 mg/dL — ABNORMAL HIGH (ref 8–23)
CO2: 21 mmol/L — ABNORMAL LOW (ref 22–32)
Calcium: 9.1 mg/dL (ref 8.9–10.3)
Chloride: 103 mmol/L (ref 98–111)
Creatinine, Ser: 2.07 mg/dL — ABNORMAL HIGH (ref 0.44–1.00)
GFR, Estimated: 27 mL/min — ABNORMAL LOW (ref >60)
Glucose, Bld: 330 mg/dL — ABNORMAL HIGH (ref 70–99)
Potassium: 4 mmol/L (ref 3.5–5.1)
Sodium: 135 mmol/L (ref 135–145)
Total Bilirubin: 1.2 mg/dL (ref 0.3–1.2)
Total Protein: 7.6 g/dL (ref 6.5–8.1)

## 2021-04-05 LAB — CBC WITH DIFFERENTIAL/PLATELET
Abs Immature Granulocytes: 0.13 10*3/uL — ABNORMAL HIGH (ref 0.00–0.07)
Basophils Absolute: 0 10*3/uL (ref 0.0–0.1)
Basophils Relative: 0 %
Eosinophils Absolute: 0.1 10*3/uL (ref 0.0–0.5)
Eosinophils Relative: 0 %
HCT: 51 % — ABNORMAL HIGH (ref 36.0–46.0)
Hemoglobin: 16.9 g/dL — ABNORMAL HIGH (ref 12.0–15.0)
Immature Granulocytes: 1 %
Lymphocytes Relative: 10 %
Lymphs Abs: 1.6 10*3/uL (ref 0.7–4.0)
MCH: 33 pg (ref 26.0–34.0)
MCHC: 33.1 g/dL (ref 30.0–36.0)
MCV: 99.6 fL (ref 80.0–100.0)
Monocytes Absolute: 1 10*3/uL (ref 0.1–1.0)
Monocytes Relative: 6 %
Neutro Abs: 13 10*3/uL — ABNORMAL HIGH (ref 1.7–7.7)
Neutrophils Relative %: 83 %
Platelets: 297 10*3/uL (ref 150–400)
RBC: 5.12 MIL/uL — ABNORMAL HIGH (ref 3.87–5.11)
RDW: 13.8 % (ref 11.5–15.5)
WBC: 15.8 10*3/uL — ABNORMAL HIGH (ref 4.0–10.5)
nRBC: 0 % (ref 0.0–0.2)

## 2021-04-05 LAB — ETHANOL: Alcohol, Ethyl (B): <10 mg/dL (ref <10)

## 2021-04-05 MED ORDER — SODIUM CHLORIDE 0.9 % IV BOLUS
1000.0000 mL | Freq: Once | INTRAVENOUS | Status: AC
  Administered 2021-04-05: 1000 mL via INTRAVENOUS

## 2021-04-05 MED ORDER — NALOXONE HCL 4 MG/0.1ML NA LIQD: 1.0000 | Freq: Once | NASAL | 0 refills | Status: DC

## 2021-04-05 NOTE — Discharge Instructions (Addendum)
Stop doing drugs.  Please fill Narcan prescription.  Follow-up with primary doctor and have all of your electrolytes and kidney function rechecked next week.  Come back to ER if you change your mind, you have any difficulty breathing or other new concerning symptom.

## 2021-04-05 NOTE — ED Notes (Signed)
Bear hugger placed

## 2021-04-05 NOTE — ED Provider Notes (Signed)
[facility] EMERGENCY DEPARTMENT Provider Note   CSN: [id number] Arrival date & time: 04/05/21  2014     History No chief complaint on file.   Traci Buchanan is a 65 y.o. female.  Presents to ER with report of unresponsiveness.  EMS report that patient was found unresponsive with agonal breathing and pinpoint pupils.  Received 8 mg of Narcan.  They were performing BVM when eventually patient became aroused.  Was fully aroused by arrival to ER.  Upon questioning, patient does endorse doing both heroin as well as crack cocaine today.  She denies any ongoing difficulty in breathing, no chest pain.  No nausea or vomiting.  Denies thoughts of hurting herself or hurting other people.  States that this was recreational use.   HPI     Past Medical History:  Diagnosis Date   Elbow fracture, left     There are no problems to display for this patient.   No past surgical history on file.   OB History   No obstetric history on file.     Family History  Problem Relation Age of Onset   Diabetes Mother     Social History   Tobacco Use   Smoking status: Every Day    Packs/day: 0.50    Types: Cigarettes   Smokeless tobacco: Never  Substance Use Topics   Alcohol use: Yes   Drug use: No    Home Medications Prior to Admission medications   Medication Sig Start Date End Date Taking? Authorizing Provider  naloxone [facility]) nasal spray 4 mg/0.1 mL Place 1 spray into the nose once for 1 dose. 04/05/21 04/05/21 Yes Shasha Buchbinder, Ellwood Dense, MD  albuterol (PROVENTIL HFA;VENTOLIN HFA) 108 (90 Base) MCG/ACT inhaler Inhale 1-2 puffs into the lungs every 4 (four) hours as needed for wheezing or shortness of breath (or cough). 11/23/17   Street, [place], PA-C  azithromycin (ZITHROMAX Z-PAK) 250 MG tablet 2 po day one, then 1 daily x 4 days Patient not taking: Reported on 11/23/2017 09/15/16   Drenda Freeze, MD  indomethacin (INDOCIN) 50 MG capsule Take 1 capsule (50 mg total)  by mouth 3 (three) times daily with meals. Patient not taking: Reported on 09/15/2016 12/27/14   Waldemar Dickens, MD  ondansetron (ZOFRAN ODT) 4 MG disintegrating tablet Take 1 tablet (4 mg total) by mouth every 8 (eight) hours as needed for nausea or vomiting. 11/23/17   Street, [place], PA-C    Allergies    Patient has no known allergies.  Review of Systems   Review of Systems  Constitutional:  Negative for chills and fever.  HENT:  Negative for ear pain and sore throat.   Eyes:  Negative for pain and visual disturbance.  Respiratory:  Negative for cough and shortness of breath.   Cardiovascular:  Negative for chest pain and palpitations.  Gastrointestinal:  Negative for abdominal pain and vomiting.  Genitourinary:  Negative for dysuria and hematuria.  Musculoskeletal:  Negative for arthralgias and back pain.  Skin:  Negative for color change and rash.  Neurological:  Positive for syncope. Negative for seizures.  All other systems reviewed and are negative.  Physical Exam Updated Vital Signs BP 110/79   Pulse 68   Temp (!) 97.5 F (36.4 C) (Oral)   Resp 11   SpO2 96%   Physical Exam Vitals and nursing note reviewed.  Constitutional:      General: She is not in acute distress.    Appearance: She is  well-developed.  HENT:     Head: Normocephalic and atraumatic.  Eyes:     Conjunctiva/sclera: Conjunctivae normal.  Cardiovascular:     Rate and Rhythm: Normal rate and regular rhythm.     Heart sounds: No murmur heard. Pulmonary:     Effort: Pulmonary effort is normal. No respiratory distress.     Breath sounds: Normal breath sounds.  Abdominal:     Palpations: Abdomen is soft.     Tenderness: There is no abdominal tenderness.  Musculoskeletal:     Cervical back: Neck supple.  Skin:    General: Skin is warm and dry.  Neurological:     Mental Status: She is alert.    ED Results / Procedures / Treatments   Labs (all labs ordered are listed, but only abnormal results  are displayed) Labs Reviewed  COMPREHENSIVE METABOLIC PANEL - Abnormal; Notable for the following components:      Result Value   CO2 21 (*)    Glucose, Bld 330 (*)    BUN 26 (*)    Creatinine, Ser 2.07 (*)    GFR, Estimated 27 (*)    All other components within normal limits  CBC WITH DIFFERENTIAL/PLATELET - Abnormal; Notable for the following components:   WBC 15.8 (*)    RBC 5.12 (*)    Hemoglobin 16.9 (*)    HCT 51.0 (*)    Neutro Abs 13.0 (*)    Abs Immature Granulocytes 0.13 (*)    All other components within normal limits  ETHANOL  CBC WITH DIFFERENTIAL/PLATELET  RAPID URINE DRUG SCREEN, HOSP PERFORMED  URINALYSIS, ROUTINE W REFLEX MICROSCOPIC    EKG EKG Interpretation  Date/Time:  Thursday April 05 2021 20:35:42 EDT Ventricular Rate:  57 PR Interval:  161 QRS Duration: 104 QT Interval:  519 QTC Calculation: 506 R Axis:   79 Text Interpretation: Sinus rhythm Right atrial enlargement Probable left ventricular hypertrophy Nonspecific T abnormalities, inferior leads Prolonged QT interval Artifact in lead(s) I II III aVR aVL aVF V1 V2 V3 V4 V5 V6 Confirmed by Madalyn Rob [id number]) on 04/05/2021 11:27:00 PM  Radiology DG Chest Portable 1 View  Result Date: 04/05/2021 CLINICAL DATA:  65 year old female with shortness of breath. EXAM: PORTABLE CHEST 1 VIEW COMPARISON:  Chest radiograph dated 11/23/2017. FINDINGS: The heart size and mediastinal contours are within normal limits. Both lungs are clear. The visualized skeletal structures are unremarkable. IMPRESSION: No active disease. Electronically Signed   By: Anner Crete M.D.   On: 04/05/2021 21:50    Procedures Procedures   Medications Ordered in ED Medications  sodium chloride 0.9 % bolus 1,000 mL (has no administration in time range)    ED Course  I have reviewed the triage vital signs and the nursing notes.  Pertinent labs & imaging results that were available during my care of the patient were reviewed  by me and considered in my medical decision making (see chart for details).    MDM Rules/Calculators/A&P                          65 year old lady presents to ER after unresponsive state.  Upon arrival to ER, she was fully alert and oriented.  No further episodes of decreased responsiveness.  She did endorse heroin and cocaine use.  Suspect opiate overdose.  Basic lab work notable for elevation in creatinine, AKI.  Vitals noted for hypothermia.  Suspect this is from her unresponsive state.  This resolved with bear  hugger.  Given her presenting hypothermia and AKI, strongly recommended to patient that she be admitted for further observation and rehydration.  Discussed risk and benefits of admission versus discharge.  Patient acknowledged my concerns.  We will have patient sign out AMA.  Will provide prescription for Narcan.  Counseled on importance of cessation from her drugs. Addendum nurse notified me that patient has changed her mind and she would like to be admitted.  Will place consult to admission for unassigned medicine for further observation rehydration and monitoring of her kidney function and electrolytes.  Final Clinical Impression(s) / ED Diagnoses Final diagnoses:  AKI (acute kidney injury) ([place])  Hypothermia, initial encounter  Opiate overdose, accidental or unintentional, initial encounter ([place])  Polysubstance abuse ([place])    Rx / DC Orders ED Discharge Orders          Ordered    naloxone (NARCAN) nasal spray 4 mg/0.1 mL   Once        04/05/21 2330             Lucrezia Starch, MD 04/05/21 2348

## 2021-04-05 NOTE — ED Notes (Signed)
Warm blankets applied. Will notify MD of temperature.

## 2021-04-05 NOTE — ED Triage Notes (Signed)
Pt bib EMS from home. Pt found unresponsive with agonal breathing and pin point pupils. 8mg  of narcan administered and ventilation started. Pt was ventilated continuously en route. Aroused about 5 minutes before coming to ED. Pt now A&Ox4 admits to smoking crack and heroin. No complaints of pain.   200cc of fluids given 18G in right AC  Initial vitals:  BP: 90/52 HR: 50's   Vitals: BP: 140/82 HR: 50 CBG: 440

## 2021-04-06 ENCOUNTER — Encounter (HOSPITAL_COMMUNITY): Payer: Self-pay | Admitting: Internal Medicine

## 2021-04-06 ENCOUNTER — Other Ambulatory Visit (HOSPITAL_COMMUNITY): Payer: Self-pay

## 2021-04-06 ENCOUNTER — Other Ambulatory Visit: Payer: Self-pay

## 2021-04-06 DIAGNOSIS — T40601A Poisoning by unspecified narcotics, accidental (unintentional), initial encounter: Secondary | ICD-10-CM

## 2021-04-06 DIAGNOSIS — N179 Acute kidney failure, unspecified: Secondary | ICD-10-CM

## 2021-04-06 DIAGNOSIS — F1910 Other psychoactive substance abuse, uncomplicated: Secondary | ICD-10-CM

## 2021-04-06 LAB — URINALYSIS, ROUTINE W REFLEX MICROSCOPIC
Bacteria, UA: NONE SEEN
Bilirubin Urine: NEGATIVE
Glucose, UA: >=500 mg/dL — AB
Ketones, ur: NEGATIVE mg/dL
Leukocytes,Ua: NEGATIVE
Nitrite: NEGATIVE
Protein, ur: 30 mg/dL — AB
Specific Gravity, Urine: 1.014 (ref 1.005–1.030)
pH: 6 (ref 5.0–8.0)

## 2021-04-06 LAB — CBC
HCT: 42 % (ref 36.0–46.0)
Hemoglobin: 13.7 g/dL (ref 12.0–15.0)
MCH: 32.5 pg (ref 26.0–34.0)
MCHC: 32.6 g/dL (ref 30.0–36.0)
MCV: 99.5 fL (ref 80.0–100.0)
Platelets: 256 10*3/uL (ref 150–400)
RBC: 4.22 MIL/uL (ref 3.87–5.11)
RDW: 13.8 % (ref 11.5–15.5)
WBC: 8.7 10*3/uL (ref 4.0–10.5)
nRBC: 0 % (ref 0.0–0.2)

## 2021-04-06 LAB — GLUCOSE, CAPILLARY: Glucose-Capillary: 92 mg/dL (ref 70–99)

## 2021-04-06 LAB — BASIC METABOLIC PANEL
Anion gap: 6 (ref 5–15)
BUN: 21 mg/dL (ref 8–23)
CO2: 23 mmol/L (ref 22–32)
Calcium: 8.8 mg/dL — ABNORMAL LOW (ref 8.9–10.3)
Chloride: 105 mmol/L (ref 98–111)
Creatinine, Ser: 1.39 mg/dL — ABNORMAL HIGH (ref 0.44–1.00)
GFR, Estimated: 43 mL/min — ABNORMAL LOW (ref >60)
Glucose, Bld: 120 mg/dL — ABNORMAL HIGH (ref 70–99)
Potassium: 5.1 mmol/L (ref 3.5–5.1)
Sodium: 134 mmol/L — ABNORMAL LOW (ref 135–145)

## 2021-04-06 LAB — HIV ANTIBODY (ROUTINE TESTING W REFLEX): HIV Screen 4th Generation wRfx: NONREACTIVE

## 2021-04-06 LAB — RESP PANEL BY RT-PCR (FLU A&B, COVID) ARPGX2
Influenza A by PCR: NEGATIVE
Influenza B by PCR: NEGATIVE
SARS Coronavirus 2 by RT PCR: NEGATIVE

## 2021-04-06 LAB — CBG MONITORING, ED
Glucose-Capillary: 133 mg/dL — ABNORMAL HIGH (ref 70–99)
Glucose-Capillary: 152 mg/dL — ABNORMAL HIGH (ref 70–99)
Glucose-Capillary: 51 mg/dL — ABNORMAL LOW (ref 70–99)
Glucose-Capillary: 82 mg/dL (ref 70–99)
Glucose-Capillary: 85 mg/dL (ref 70–99)

## 2021-04-06 LAB — RAPID URINE DRUG SCREEN, HOSP PERFORMED
Amphetamines: NOT DETECTED
Barbiturates: NOT DETECTED
Benzodiazepines: NOT DETECTED
Cocaine: POSITIVE — AB
Opiates: NOT DETECTED
Tetrahydrocannabinol: POSITIVE — AB

## 2021-04-06 LAB — HEMOGLOBIN A1C
Hgb A1c MFr Bld: 5.9 % — ABNORMAL HIGH (ref 4.8–5.6)
Mean Plasma Glucose: 122.63 mg/dL

## 2021-04-06 LAB — MAGNESIUM: Magnesium: 2 mg/dL (ref 1.7–2.4)

## 2021-04-06 LAB — PHOSPHORUS: Phosphorus: 4.1 mg/dL (ref 2.5–4.6)

## 2021-04-06 MED ORDER — INSULIN ASPART 100 UNIT/ML IJ SOLN: 0.0000 [IU] | Freq: Every day | INTRAMUSCULAR | Status: DC

## 2021-04-06 MED ORDER — DEXTROSE 50 % IV SOLN: 1.0000 | Freq: Once | INTRAVENOUS | Status: AC

## 2021-04-06 MED ORDER — DEXTROSE 50 % IV SOLN
INTRAVENOUS | Status: AC
  Administered 2021-04-06: 50 mL via INTRAVENOUS
  Filled 2021-04-06: qty 50

## 2021-04-06 MED ORDER — ENOXAPARIN SODIUM 40 MG/0.4ML IJ SOSY
40.0000 mg | PREFILLED_SYRINGE | INTRAMUSCULAR | Status: DC
  Administered 2021-04-06: 40 mg via SUBCUTANEOUS
  Filled 2021-04-06: qty 0.4

## 2021-04-06 MED ORDER — INSULIN ASPART 100 UNIT/ML IJ SOLN: 0.0000 [IU] | Freq: Three times a day (TID) | INTRAMUSCULAR | Status: DC

## 2021-04-06 MED ORDER — ACETAMINOPHEN 325 MG PO TABS: 650.0000 mg | ORAL_TABLET | Freq: Four times a day (QID) | ORAL | Status: DC | PRN

## 2021-04-06 MED ORDER — NALOXONE HCL 4 MG/0.1ML NA LIQD
1.0000 | Freq: Once | NASAL | 0 refills | Status: AC
  Filled 2021-04-06: qty 2, 2d supply, fill #0

## 2021-04-06 MED ORDER — LACTATED RINGERS IV SOLN: INTRAVENOUS | Status: DC

## 2021-04-06 MED ORDER — PROCHLORPERAZINE EDISYLATE 10 MG/2ML IJ SOLN: 5.0000 mg | Freq: Four times a day (QID) | INTRAMUSCULAR | Status: DC | PRN

## 2021-04-06 MED ORDER — ENOXAPARIN SODIUM 40 MG/0.4ML IJ SOSY: 40.0000 mg | PREFILLED_SYRINGE | INTRAMUSCULAR | Status: DC

## 2021-04-06 NOTE — H&P (Addendum)
History and Physical  FOSTER SONNIER XIP:[id number] DOB: 1956/06/12 DOA: 04/05/2021  Referring physician: Dr. Roslynn Amble, [place]. PCP: Marliss Coots, NP  Outpatient Specialists: None Patient coming from: Found unresponsive  Chief Complaint: Unresponsiveness.  HPI: Traci Buchanan is a 65 y.o. female with medical history significant for polysubstance abuse including cocaine and THC was found unresponsive with agonal breathing and pinpoint pupils at home.  States she snorted a little bit of crack and heroin, denies injecting drugs, states the last time she injected heroin was when she was 65 years old.  She was hypothermic and hypotensive.  Brought in via EMS.  In route she received 8 mg of Narcan and continuous ventilation then eventually became more alert.  In the ED, patient was alert and oriented x4.  She denies suicidal or homicidal ideation.  She endorses doing both heroin and crack cocaine recreationally on the day of presentation.  Denies any cardiopulmonary or GI symptoms.  She received a liter of IV fluid bolus normal saline.  Due to concern for dehydration with elevated creatinine 2.07 from baseline of 1.1, TR[facility], hospitalist team, was asked to admit.  ED Course:  Temperature 92.6 initially improved to 97.5 post warming blankets and bear hugger.  BP 105/78, pulse 57, respiration rate 13, O2 saturation 95% on room air.  Review of Systems: Review of systems as noted in the HPI. All other systems reviewed and are negative.   Past Medical History:  Diagnosis Date   Elbow fracture, left    No past surgical history on file.  Social History:  reports that she has been smoking cigarettes. She has been smoking an average of .5 packs per day. She has never used smokeless tobacco. She reports current alcohol use. She reports that she does not use drugs.   No Known Allergies  Family History  Problem Relation Age of Onset   Diabetes Mother       Prior to Admission medications    Medication Sig Start Date End Date Taking? Authorizing Provider  albuterol (PROVENTIL HFA;VENTOLIN HFA) 108 (90 Base) MCG/ACT inhaler Inhale 1-2 puffs into the lungs every 4 (four) hours as needed for wheezing or shortness of breath (or cough). Patient not taking: Reported on 04/06/2021 11/23/17   Street, [place], PA-C  azithromycin (ZITHROMAX Z-PAK) 250 MG tablet 2 po day one, then 1 daily x 4 days Patient not taking: No sig reported 09/15/16   Drenda Freeze, MD  indomethacin (INDOCIN) 50 MG capsule Take 1 capsule (50 mg total) by mouth 3 (three) times daily with meals. Patient not taking: No sig reported 12/27/14   Waldemar Dickens, MD  ondansetron (ZOFRAN ODT) 4 MG disintegrating tablet Take 1 tablet (4 mg total) by mouth every 8 (eight) hours as needed for nausea or vomiting. Patient not taking: Reported on 04/06/2021 11/23/17   Street, [place], [place]    Physical Exam: BP 100/77   Pulse (!) 56   Temp (!) 97.5 F (36.4 C) (Oral)   Resp (!) 9   SpO2 95%   General: 65 y.o. year-old female well developed well nourished in no acute distress.  Alert and oriented x3. Cardiovascular: Regular rate and rhythm with no rubs or gallops.  No thyromegaly or JVD noted.  No lower extremity edema. 2/4 pulses in all 4 extremities. Respiratory: Clear to auscultation with no wheezes or rales. Good inspiratory effort. Abdomen: Soft nontender nondistended with normal bowel sounds x4 quadrants. Muskuloskeletal: No cyanosis, clubbing or edema noted bilaterally Neuro: CN II-XII  intact, strength, sensation, reflexes Skin: No ulcerative lesions noted or rashes Psychiatry: Judgement and insight appear normal. Mood is appropriate for condition and setting          Labs on Admission:  Basic Metabolic Panel: Recent Labs  Lab 04/05/21 2021  NA 135  K 4.0  CL 103  CO2 21*  GLUCOSE 330*  BUN 26*  CREATININE 2.07*  CALCIUM 9.1   Liver Function Tests: Recent Labs  Lab 04/05/21 2021  AST 28  ALT 19   ALKPHOS 71  BILITOT 1.2  PROT 7.6  ALBUMIN 4.0   No results for input(s): LIPASE, AMYLASE in the last 168 hours. No results for input(s): AMMONIA in the last 168 hours. CBC: Recent Labs  Lab 04/05/21 2145  WBC 15.8*  NEUTROABS 13.0*  HGB 16.9*  HCT 51.0*  MCV 99.6  PLT 297   Cardiac Enzymes: No results for input(s): CKTOTAL, CKMB, CKMBINDEX, TROPONINI in the last 168 hours.  BNP (last 3 results) No results for input(s): BNP in the last 8760 hours.  ProBNP (last 3 results) No results for input(s): PROBNP in the last 8760 hours.  CBG: Recent Labs  Lab 04/06/21 0021 04/06/21 0113 04/06/21 0322  GLUCAP 51* 152* 133*    Radiological Exams on Admission: DG Chest Portable 1 View  Result Date: 04/05/2021 CLINICAL DATA:  65 year old female with shortness of breath. EXAM: PORTABLE CHEST 1 VIEW COMPARISON:  Chest radiograph dated 11/23/2017. FINDINGS: The heart size and mediastinal contours are within normal limits. Both lungs are clear. The visualized skeletal structures are unremarkable. IMPRESSION: No active disease. Electronically Signed   By: Anner Crete M.D.   On: 04/05/2021 21:50    EKG: I independently viewed the EKG done and my findings are as followed: Sinus rhythm rate of 57, nonspecific ST-T changes.  QTc 506.  Assessment/Plan Present on Admission:  AKI (acute kidney injury) ([place])  Active Problems:   AKI (acute kidney injury) ([place])  AKI, suspect prerenal in the setting of dehydration from poor oral intake Presented with creatinine of 2.07 with GFR 27 Baseline creatinine appears to be 1.1 with GFR greater than 60. Avoid nephrotoxic agents/dehydration and hypotension. Monitor urine output Repeat BMP in the morning.  Acute toxic metabolic encephalopathy, resolved. Suspect secondary to accidental drug overdose. She received 8 mg of Narcan from EMS in route Back to her baseline mentation.  Hypotensive, hypothermic in the setting of likely accidental  drug overdose UDS positive for cocaine, THC Endorses smoking crack cocaine and doing heroin recreationally. Hypotension responded to IV fluid bolus She was treated with warming blankets and bear hugger Hypotension and hypothermia have resolved.  Non-anion gap metabolic acidosis likely secondary to renal insufficiency Presented with serum bicarb of 21, anion gap of 11 Continue IV fluid hydration Repeat BMP in the morning  Leukocytosis, suspect reactive WBC on presentation 15.8 UA negative for pyuria Chest x-ray unremarkable Nontoxic-appearing Repeat in the morning  Type 2 diabetes with hyperglycemia Obtain hemoglobin A1c Insulin sliding scale.  Polysubstance abuse including cocaine and THC UDS from 04/06/2021 positive for cocaine and THC TOC consulted to assist with providing resources for sobriety if patient is agreeable. Polysubstance cessation counseling done at bedside. At this time, patient does not think she has an addiction problem   DVT prophylaxis: Subcu Lovenox daily  Code Status: Full code  Family Communication: None at bedside  Disposition Plan: Admit to telemetry medical  Consults called: None  Admission status: Observation status   Status is: Observation  Dispo:  Patient From: Home  Planned Disposition: Homelessness, possibly on 04/06/2021.  TOC consulted to assist with DC planning.  Medically stable for discharge: No      Kayleen Memos MD [facility] Pager [phone]  If 7PM-7AM, please contact night-coverage [email or web address] Password [facility]  04/06/2021, 3:44 AM

## 2021-04-06 NOTE — Discharge Summary (Signed)
Physician Discharge Summary  Traci Buchanan HLK:[id number] DOB: 12-24-1955 DOA: 04/05/2021  PCP: Marliss Coots, NP  Admit date: 04/05/2021 Discharge date: 04/06/2021  Admitted From: Home Disposition:  Home   Recommendations for Outpatient Follow-up:  Follow up with PCP in 1-2 weeks   Home Health:NO  Discharge Condition:Stable, CODE STATUS:FULL Diet recommendation:  Regular  Brief/Interim Summary:   Traci Buchanan is a 65 y.o. female with medical history significant for polysubstance abuse including cocaine and THC was found unresponsive with agonal breathing and pinpoint pupils at home.  States she snorted a little bit of crack and heroin, denies injecting drugs, states the last time she injected heroin was when she was 65 years old.  She was hypothermic and hypotensive.  Brought in via EMS.  In route she received 8 mg of Narcan and continuous ventilation then eventually became more alert.  In the ED, patient was alert and oriented x4.  She denies suicidal or homicidal ideation.  She endorses doing both heroin and crack cocaine recreationally on the day of presentation.  Denies any cardiopulmonary or GI symptoms.  She received a liter of IV fluid bolus normal saline.  Due to concern for dehydration with elevated creatinine 2.07 from baseline of 1.1, TR[facility], hospitalist team, was asked to admit.     Polysubstance abuse /accidental drug overdose including cocaine and THC UDS from 04/06/2021 positive for cocaine and THC TOC consulted to assist with providing resources for sobriety if patient is agreeable. Polysubstance cessation counseling done at bedside.  I have discussed multiple times with the patient, as well her Sister Traci Buchanan was on the phone, who will monitor her closely, patient was given Narcan kit on discharge from pharmacy as well. At this time, patient does not think she has an addiction problem   AKI, suspect prerenal in the setting of dehydration from poor oral  intake -Baseline creatinine is around 1, elevated at 2 on admission, received IV fluids with significant improvement, most recent is 1.39, and she did receive around 1 L IV fluids after that, so it certainly did improve further,.  Acute toxic metabolic encephalopathy, resolved. Suspect secondary to accidental drug overdose. She received 8 mg of Narcan from EMS in route Back to her baseline mentation.  He is awake, appropriate, did eat breakfast and lunch.   Hypotensive, hypothermic in the setting of likely accidental drug overdose UDS positive for cocaine, THC Endorses smoking crack cocaine and doing heroin recreationally. This has resolved, normal temperature, blood pressure is within normal limit   Non-anion gap metabolic acidosis likely secondary to renal insufficiency Presented with serum bicarb of 21, anion gap of 11 Resolved, anion gap of 6 on discharge, bicarb of 23   Leukocytosis, suspect reactive Reactive, resolved, 8.7 on discharge   Type 2 diabetes with hyperglycemia A1c is 5.9    Discharge Diagnoses:  Active Problems:   AKI (acute kidney injury) [facility])    Discharge Instructions  Discharge Instructions     Diet - low sodium heart healthy   Complete by: As directed    Increase activity slowly   Complete by: As directed       Allergies as of 04/06/2021   No Known Allergies      Medication List     STOP taking these medications    azithromycin 250 MG tablet Commonly known as: Zithromax Z-Pak   indomethacin 50 MG capsule Commonly known as: INDOCIN   ondansetron 4 MG disintegrating tablet Commonly known as: Zofran ODT  TAKE these medications    albuterol 108 (90 Base) MCG/ACT inhaler Commonly known as: VENTOLIN HFA Inhale 1-2 puffs into the lungs every 4 (four) hours as needed for wheezing or shortness of breath (or cough).   Narcan 4 MG/0.1ML Liqd nasal spray kit Generic drug: naloxone Place 1 spray into the nose once for 1 dose.         Follow-up Information     Placey, Audrea Muscat, NP .   Contact information: [address]  [place] [address] [phone]                No Known Allergies  Consultations: None   Procedures/Studies: DG Chest Portable 1 View  Result Date: 04/05/2021 CLINICAL DATA:  65 year old female with shortness of breath. EXAM: PORTABLE CHEST 1 VIEW COMPARISON:  Chest radiograph dated 11/23/2017. FINDINGS: The heart size and mediastinal contours are within normal limits. Both lungs are clear. The visualized skeletal structures are unremarkable. IMPRESSION: No active disease. Electronically Signed   By: Anner Crete M.D.   On: 04/05/2021 21:50   (Echo, Carotid, EGD, Colonoscopy, ERCP)    Subjective: Patient is awake, appropriate, coherent, denies any complaints  Discharge Exam: Vitals:   04/06/21 1230 04/06/21 1429  BP: (!) 142/95 (!) 139/96  Pulse: (!) 50 (!) 56  Resp: 15 17  Temp:  98.2 F (36.8 C)  SpO2: 95% 97%   Vitals:   04/06/21 1215 04/06/21 1230 04/06/21 1400 04/06/21 1429  BP: (!) 149/86 (!) 142/95  (!) 139/96  Pulse: (!) 52 (!) 50  (!) 56  Resp: 10 15  17   Temp:    98.2 F (36.8 C)  TempSrc:    Oral  SpO2: 97% 95%  97%  Weight:   54.2 kg   Height:   5' 5"  (1.651 m)     General: Pt is alert, awake, not in acute distress Cardiovascular: RRR, S1/S2 +, no rubs, no gallops Respiratory: CTA bilaterally, no wheezing, no rhonchi Abdominal: Soft, NT, ND, bowel sounds + Extremities: no edema, no cyanosis    The results of significant diagnostics from this hospitalization (including imaging, microbiology, ancillary and laboratory) are listed below for reference.     Microbiology: Recent Results (from the past 240 hour(s))  Resp Panel by RT-PCR (Flu A&B, Covid) Nasopharyngeal Swab     Status: None   Collection Time: 04/06/21  2:15 AM   Specimen: Nasopharyngeal Swab; Nasopharyngeal(NP) swabs in vial transport medium  Result Value Ref Range Status    SARS Coronavirus 2 by RT PCR NEGATIVE NEGATIVE Final    Comment: (NOTE) SARS-CoV-2 target nucleic acids are NOT DETECTED.  The SARS-CoV-2 RNA is generally detectable in upper respiratory specimens during the acute phase of infection. The lowest concentration of SARS-CoV-2 viral copies this assay can detect is 138 copies/mL. A negative result does not preclude SARS-Cov-2 infection and should not be used as the sole basis for treatment or other patient management decisions. A negative result may occur with  improper specimen collection/handling, submission of specimen other than nasopharyngeal swab, presence of viral mutation(s) within the areas targeted by this assay, and inadequate number of viral copies(<138 copies/mL). A negative result must be combined with clinical observations, patient history, and epidemiological information. The expected result is Negative.  Fact Sheet for Patients:  EntrepreneurPulse.com.au  Fact Sheet for Healthcare Providers:  IncredibleEmployment.be  This test is no t yet approved or cleared by the [place] FDA and  has been authorized for detection and/or diagnosis of SARS-CoV-2  by FDA under an Emergency Use Authorization (EUA). This EUA will remain  in effect (meaning this test can be used) for the duration of the COVID-19 declaration under Section 564(b)(1) of the Act, 21 U.S.C.section 360bbb-3(b)(1), unless the authorization is terminated  or revoked sooner.       Influenza A by PCR NEGATIVE NEGATIVE Final   Influenza B by PCR NEGATIVE NEGATIVE Final    Comment: (NOTE) The Xpert Xpress SARS-CoV-2/FLU/RSV plus assay is intended as an aid in the diagnosis of influenza from Nasopharyngeal swab specimens and should not be used as a sole basis for treatment. Nasal washings and aspirates are unacceptable for Xpert Xpress SARS-CoV-2/FLU/RSV testing.  Fact Sheet for  Patients: EntrepreneurPulse.com.au  Fact Sheet for Healthcare Providers: IncredibleEmployment.be  This test is not yet approved or cleared by the [place] FDA and has been authorized for detection and/or diagnosis of SARS-CoV-2 by FDA under an Emergency Use Authorization (EUA). This EUA will remain in effect (meaning this test can be used) for the duration of the COVID-19 declaration under Section 564(b)(1) of the Act, 21 U.S.C. section 360bbb-3(b)(1), unless the authorization is terminated or revoked.  Performed at [facility], [place] [address], [place], [place] [address]      Labs: BNP (last 3 results) No results for input(s): BNP in the last 8760 hours. Basic Metabolic Panel: Recent Labs  Lab 04/05/21 2021 04/06/21 0332  NA 135 134*  K 4.0 5.1  CL 103 105  CO2 21* 23  GLUCOSE 330* 120*  BUN 26* 21  CREATININE 2.07* 1.39*  CALCIUM 9.1 8.8*  MG  --  2.0  PHOS  --  4.1   Liver Function Tests: Recent Labs  Lab 04/05/21 2021  AST 28  ALT 19  ALKPHOS 71  BILITOT 1.2  PROT 7.6  ALBUMIN 4.0   No results for input(s): LIPASE, AMYLASE in the last 168 hours. No results for input(s): AMMONIA in the last 168 hours. CBC: Recent Labs  Lab 04/05/21 2145 04/06/21 0517  WBC 15.8* 8.7  NEUTROABS 13.0*  --   HGB 16.9* 13.7  HCT 51.0* 42.0  MCV 99.6 99.5  PLT 297 256   Cardiac Enzymes: No results for input(s): CKTOTAL, CKMB, CKMBINDEX, TROPONINI in the last 168 hours. BNP: Invalid input(s): POCBNP CBG: Recent Labs  Lab 04/06/21 0021 04/06/21 0113 04/06/21 0322 04/06/21 0731 04/06/21 1230  GLUCAP 51* 152* 133* 85 82   D-Dimer No results for input(s): DDIMER in the last 72 hours. Hgb A1c Recent Labs    04/06/21 1446  HGBA1C 5.9*   Lipid Profile No results for input(s): CHOL, HDL, LDLCALC, TRIG, CHOLHDL, LDLDIRECT in the last 72 hours. Thyroid function studies No results for input(s): TSH, T4TOTAL, T3FREE,  THYROIDAB in the last 72 hours.  Invalid input(s): FREET3 Anemia work up No results for input(s): VITAMINB12, FOLATE, FERRITIN, TIBC, IRON, RETICCTPCT in the last 72 hours. Urinalysis    Component Value Date/Time   COLORURINE YELLOW 04/06/2021 0001   APPEARANCEUR HAZY (A) 04/06/2021 0001   LABSPEC 1.014 04/06/2021 0001   PHURINE 6.0 04/06/2021 0001   GLUCOSEU >=500 (A) 04/06/2021 0001   HGBUR SMALL (A) 04/06/2021 0001   BILIRUBINUR NEGATIVE 04/06/2021 0001   KETONESUR NEGATIVE 04/06/2021 0001   PROTEINUR 30 (A) 04/06/2021 0001   UROBILINOGEN 1.0 08/08/2010 1927   NITRITE NEGATIVE 04/06/2021 0001   LEUKOCYTESUR NEGATIVE 04/06/2021 0001   Sepsis Labs Invalid input(s): PROCALCITONIN,  WBC,  LACTICIDVEN Microbiology Recent Results (from the past 240  hour(s))  Resp Panel by RT-PCR (Flu A&B, Covid) Nasopharyngeal Swab     Status: None   Collection Time: 04/06/21  2:15 AM   Specimen: Nasopharyngeal Swab; Nasopharyngeal(NP) swabs in vial transport medium  Result Value Ref Range Status   SARS Coronavirus 2 by RT PCR NEGATIVE NEGATIVE Final    Comment: (NOTE) SARS-CoV-2 target nucleic acids are NOT DETECTED.  The SARS-CoV-2 RNA is generally detectable in upper respiratory specimens during the acute phase of infection. The lowest concentration of SARS-CoV-2 viral copies this assay can detect is 138 copies/mL. A negative result does not preclude SARS-Cov-2 infection and should not be used as the sole basis for treatment or other patient management decisions. A negative result may occur with  improper specimen collection/handling, submission of specimen other than nasopharyngeal swab, presence of viral mutation(s) within the areas targeted by this assay, and inadequate number of viral copies(<138 copies/mL). A negative result must be combined with clinical observations, patient history, and epidemiological information. The expected result is Negative.  Fact Sheet for Patients:   EntrepreneurPulse.com.au  Fact Sheet for Healthcare Providers:  IncredibleEmployment.be  This test is no t yet approved or cleared by the [place] FDA and  has been authorized for detection and/or diagnosis of SARS-CoV-2 by FDA under an Emergency Use Authorization (EUA). This EUA will remain  in effect (meaning this test can be used) for the duration of the COVID-19 declaration under Section 564(b)(1) of the Act, 21 U.S.C.section 360bbb-3(b)(1), unless the authorization is terminated  or revoked sooner.       Influenza A by PCR NEGATIVE NEGATIVE Final   Influenza B by PCR NEGATIVE NEGATIVE Final    Comment: (NOTE) The Xpert Xpress SARS-CoV-2/FLU/RSV plus assay is intended as an aid in the diagnosis of influenza from Nasopharyngeal swab specimens and should not be used as a sole basis for treatment. Nasal washings and aspirates are unacceptable for Xpert Xpress SARS-CoV-2/FLU/RSV testing.  Fact Sheet for Patients: EntrepreneurPulse.com.au  Fact Sheet for Healthcare Providers: IncredibleEmployment.be  This test is not yet approved or cleared by the [place] FDA and has been authorized for detection and/or diagnosis of SARS-CoV-2 by FDA under an Emergency Use Authorization (EUA). This EUA will remain in effect (meaning this test can be used) for the duration of the COVID-19 declaration under Section 564(b)(1) of the Act, 21 U.S.C. section 360bbb-3(b)(1), unless the authorization is terminated or revoked.  Performed at [facility], [place] [address] [place], [place] [address]      Time coordinating discharge:  30 minutes  SIGNED:   Phillips Climes, MD  [facility] 04/06/2021, 4:38 PM Pager   If 7PM-7AM, please contact night-coverage [email or web address] Password [email or web address]

## 2021-04-06 NOTE — Plan of Care (Signed)
  Problem: Health Behavior/Discharge Planning: Goal: Ability to manage health-related needs will improve Outcome: Progressing   

## 2021-04-06 NOTE — TOC Initial Note (Signed)
Transition of Care [facility]) - Initial/Assessment Note    Patient Details  Name: Traci Buchanan MRN: [id number] Date of Birth: [birth year]  [facility] of [facility] CM/SW Contact:    Verdell Carmine, RN Phone Number: 04/06/2021, 3:03 PM  Clinical Narrative:                 Patient admitted with  AKI, Acute metabolic encephaopathy. Lives with friends, does not have insurance. May need MATCH for medications, wCM will follow for needs and CAGE assessment  Expected Discharge Plan: Home/Self Care Barriers to Discharge: Inadequate or no insurance   Patient Goals and CMS Choice        Expected Discharge Plan and Services Expected Discharge Plan: Home/Self Care   Discharge Planning Services: CM Consult   Living arrangements for the past 2 months: Single Family Home                                      Prior Living Arrangements/Services Living arrangements for the past 2 months: Single Family Home Lives with:: Friends Patient language and need for interpreter reviewed:: Yes        Need for Family Participation in Patient Care: Yes (Comment) Care giver support system in place?: Yes (comment)   Criminal Activity/Legal Involvement Pertinent to Current Situation/Hospitalization: No - Comment as needed  Activities of Daily Living Home Assistive Devices/Equipment: None ADL Screening (condition at time of admission) Patient's cognitive ability adequate to safely complete daily activities?: No Is the patient deaf or have difficulty hearing?: No Does the patient have difficulty seeing, even when wearing glasses/contacts?: No Does the patient have difficulty concentrating, remembering, or making decisions?: No Patient able to express need for assistance with ADLs?: Yes Does the patient have difficulty dressing or bathing?: No Independently performs ADLs?: Yes (appropriate for developmental age) Does the patient have difficulty walking or climbing stairs?: No Weakness of  Legs: None Weakness of Arms/Hands: None  Permission Sought/Granted                  Emotional Assessment       Orientation: : Oriented to Self Alcohol / Substance Use: Not Applicable Psych Involvement: No (comment)  Admission diagnosis:  Polysubstance abuse ([place]) [F19.10] AKI (acute kidney injury) ([place]) [N17.9] Hypothermia, initial encounter [T68.XXXA] Opiate overdose, accidental or unintentional, initial encounter [facility]) [T40.601A] Patient Active Problem List   Diagnosis Date Noted   AKI (acute kidney injury) ([place]) 04/06/2021   PCP:  Marliss Coots, NP Pharmacy:   [id number] - [address] [place] [address] Phone: [phone] Fax: [phone]  [facility] [address] [place] [address] Phone: [phone] Fax: [phone]     Social Determinants of Health (SDOH) Interventions    Readmission Risk Interventions No flowsheet data found.

## 2021-04-07 LAB — URINE CULTURE
Culture: <10000 — AB
Special Requests: NORMAL

## 2021-11-19 ENCOUNTER — Ambulatory Visit (INDEPENDENT_AMBULATORY_CARE_PROVIDER_SITE_OTHER): Payer: Self-pay

## 2021-11-19 ENCOUNTER — Ambulatory Visit (HOSPITAL_COMMUNITY)
Admission: EM | Admit: 2021-11-19 | Discharge: 2021-11-19 | Disposition: A | Discharge status: Home / Self Care | Payer: Self-pay | Attending: Nurse Practitioner | Admitting: Nurse Practitioner

## 2021-11-19 ENCOUNTER — Encounter (HOSPITAL_COMMUNITY): Payer: Self-pay

## 2021-11-19 DIAGNOSIS — M25571 Pain in right ankle and joints of right foot: Secondary | ICD-10-CM

## 2021-11-19 DIAGNOSIS — S93601A Unspecified sprain of right foot, initial encounter: Secondary | ICD-10-CM

## 2021-11-19 DIAGNOSIS — S93401A Sprain of unspecified ligament of right ankle, initial encounter: Secondary | ICD-10-CM

## 2021-11-19 DIAGNOSIS — W1842XA Slipping, tripping and stumbling without falling due to stepping into hole or opening, initial encounter: Secondary | ICD-10-CM

## 2021-11-19 MED ORDER — IBUPROFEN 600 MG PO TABS: 600.0000 mg | ORAL_TABLET | Freq: Four times a day (QID) | ORAL | 0 refills | Status: AC | PRN

## 2021-11-19 NOTE — ED Triage Notes (Signed)
Pt reports while walking she hit a hole and is now having ankle pain (right).

## 2021-11-19 NOTE — Discharge Instructions (Addendum)
Negative x-rays. Take medication as prescribed to help with pain and swelling. Apply ice to the affected areas on for 20 minutes off for an hour and then repeat.  Apply as much as possible throughout the day. Wear postop shoe and ankle brace as needed for pain and support. RICE therapy. Symptoms may take 2 to 4 weeks for resolution.  Continue to use postop shoe and ankle brace as needed.  No strenuous activity such as running or jumping during this time. Follow-up if symptoms worsen or do not improve.  If so, will make recommendations to Ortho at that time.

## 2021-11-19 NOTE — ED Provider Notes (Signed)
[place]    CSN: [id number] Arrival date & time: 11/19/21  1617      History   Chief Complaint Chief Complaint  Patient presents with   Ankle Pain    HPI Traci Buchanan is a 66 y.o. female.   Traci Buchanan is a 66 year old female who presents for right foot and right ankle pain.  Reports she stepped in a hole this morning and twisted her right foot and ankle inward.  Pain is located to the dorsal aspect of the right foot and the medial aspect of the right ankle.  She has since had pain with weightbearing.  She denies any injury to the toes today, but reports previous fractures.  She has been walking on the right foot and ankle most of the day today.  She has not performed any intervention such as ice or elevation.  She denies pain in the lower right leg, previous injury or trauma.   Ankle Pain Worsened by:  Adduction, bearing weight, extension, rotation, abduction, activity and flexion Ineffective treatments:  None tried  Past Medical History:  Diagnosis Date   Elbow fracture, left     Patient Active Problem List   Diagnosis Date Noted   AKI (acute kidney injury) ([place]) 04/06/2021    History reviewed. No pertinent surgical history.  OB History   No obstetric history on file.      Home Medications    Prior to Admission medications   Medication Sig Start Date End Date Taking? Authorizing Provider  albuterol (PROVENTIL HFA;VENTOLIN HFA) 108 (90 Base) MCG/ACT inhaler Inhale 1-2 puffs into the lungs every 4 (four) hours as needed for wheezing or shortness of breath (or cough). 11/23/17   Street, [place], PA-C    Family History Family History  Problem Relation Age of Onset   Diabetes Mother     Social History Social History   Tobacco Use   Smoking status: Every Day    Packs/day: 0.50    Types: Cigarettes   Smokeless tobacco: Never  Substance Use Topics   Alcohol use: Yes   Drug use: No     Allergies   Patient has no known  allergies.   Review of Systems Review of Systems  Constitutional: Negative.   Respiratory: Negative.    Cardiovascular: Negative.   Musculoskeletal:        Right foot and right ankle pain, pain with weight bearing, pain with ROM  Skin: Negative.     Physical Exam Triage Vital Signs ED Triage Vitals  Enc Vitals Group     BP 11/19/21 1806 (!) 152/89     Pulse Rate 11/19/21 1806 88     Resp 11/19/21 1806 16     Temp 11/19/21 1806 99.4 F (37.4 C)     Temp Source 11/19/21 1806 Oral     SpO2 11/19/21 1806 100 %     Weight --      Height --      Head Circumference --      Peak Flow --      Pain Score 11/19/21 1804 10     Pain Loc --      Pain Edu? --      Excl. in [place]? --    No data found.  Updated Vital Signs BP (!) 152/89 (BP Location: Left Arm)    Pulse 88    Temp 99.4 F (37.4 C) (Oral)    Resp 16    SpO2 100%   Visual Acuity  Right Eye Distance:   Left Eye Distance:   Bilateral Distance:    Right Eye Near:   Left Eye Near:    Bilateral Near:     Physical Exam Constitutional:      Appearance: Normal appearance. She is normal weight.  HENT:     Head: Normocephalic and atraumatic.  Cardiovascular:     Rate and Rhythm: Normal rate and regular rhythm.  Pulmonary:     Effort: Pulmonary effort is normal.     Breath sounds: Normal breath sounds.  Musculoskeletal:     Right ankle: Swelling present. No deformity, ecchymosis or lacerations. Tenderness present over the medial malleolus. Decreased range of motion. Normal pulse.     Left ankle: Normal.     Right foot: Decreased range of motion (pain with passive ROM). Normal capillary refill. Swelling and tenderness present. No deformity. Normal pulse.     Left foot: Normal.  Neurological:     Mental Status: She is alert.     UC Treatments / Results  Labs (all labs ordered are listed, but only abnormal results are displayed) Labs Reviewed - No data to display  EKG   Radiology No results  found.  Procedures Procedures (including critical care time)  Medications Ordered in UC Medications - No data to display  Initial Impression / Assessment and Plan / UC Course  I have reviewed the triage vital signs and the nursing notes.  Pertinent labs & imaging results that were available during my care of the patient were reviewed by me and considered in my medical decision making (see chart for details).  Symptoms are consistent with a right foot and right ankle sprain.  Based on the mechanism of injury which was twisting, patient now with tenderness to the dorsal aspect of the right foot along with tenderness and pain to the medial malleolus.  She is able to weight-bear but has pain when doing so.  Minimal swelling noted on exam, she has full passive range of motion.  Imaging of foot and ankle were both negative.   Final Clinical Impressions(s) / UC Diagnoses   Final diagnoses:  Sprain of right foot, initial encounter  Sprain of right ankle, unspecified ligament, initial encounter   Discharge Instructions   None    ED Prescriptions   None    PDMP not reviewed this encounter.   Tish Men, NP 11/19/21 1916

## 2023-03-26 ENCOUNTER — Telehealth: Payer: Self-pay | Admitting: *Deleted

## 2023-03-26 NOTE — Telephone Encounter (Signed)
   Telephone encounter was:  Successful.  03/26/2023 Name: Traci Buchanan MRN: [id number] DOB: Oct 24, 1955  Traci Buchanan is a 67 y.o. year old female who is a primary care patient of Pcp, No . The community resource team was consulted for assistance with  patient has not received her U card from her [facility] plan nand so I provided the number for her to outreach the [facility] community plan   Care guide performed the following interventions: Patient provided with information about care guide support team and interviewed to confirm resource needs.  Follow Up Plan:  No further follow up planned at this time. The patient has been provided with needed resources.  Yehuda Mao Greenauer -[facility], Population Health [phone] [address] [place] , [place] [address] Email : [email or web address]

## 2023-04-02 ENCOUNTER — Telehealth: Payer: Self-pay | Admitting: *Deleted

## 2023-04-02 NOTE — Telephone Encounter (Signed)
   Telephone encounter was:  Successful.  04/02/2023 Name: SAPHYRA HUTT MRN: [id number] DOB: July 29, 1956  NIKA YAZZIE is a 67 y.o. year old female who is a primary care patient of Pcp, No . The community resource team was consulted for assistance with  U card explained card comes from [facility] and she will need to reach out to them about it   Care guide performed the following interventions: Patient provided with information about care guide support team and interviewed to confirm resource needs.  Follow Up Plan:  No further follow up planned at this time. The patient has been provided with needed resources.  Yehuda Mao Greenauer -[facility] [place], Population Health [phone] [address] [place] , [place] [address] Email : [email or web address]

## 2023-06-12 ENCOUNTER — Other Ambulatory Visit (HOSPITAL_COMMUNITY): Payer: Self-pay

## 2023-06-12 ENCOUNTER — Other Ambulatory Visit: Payer: Self-pay

## 2023-06-12 MED ORDER — OXYCODONE HCL 10 MG PO TABS
10.0000 mg | ORAL_TABLET | Freq: Three times a day (TID) | ORAL | 0 refills | Status: DC
  Filled 2023-06-12 – 2023-06-13 (×3): qty 90, 30d supply, fill #0

## 2023-06-13 ENCOUNTER — Other Ambulatory Visit (HOSPITAL_COMMUNITY): Payer: Self-pay

## 2023-06-13 ENCOUNTER — Other Ambulatory Visit: Payer: Self-pay

## 2023-06-27 ENCOUNTER — Emergency Department (HOSPITAL_COMMUNITY)
Admission: EM | Admit: 2023-06-27 | Discharge: 2023-06-27 | Disposition: A | Discharge status: Home / Self Care | Payer: Medicare Other | Attending: Emergency Medicine | Admitting: Emergency Medicine

## 2023-06-27 ENCOUNTER — Encounter (HOSPITAL_COMMUNITY): Payer: Self-pay

## 2023-06-27 ENCOUNTER — Other Ambulatory Visit: Payer: Self-pay

## 2023-06-27 DIAGNOSIS — T40601A Poisoning by unspecified narcotics, accidental (unintentional), initial encounter: Secondary | ICD-10-CM

## 2023-06-27 DIAGNOSIS — T402X1A Poisoning by other opioids, accidental (unintentional), initial encounter: Secondary | ICD-10-CM | POA: Insufficient documentation

## 2023-06-27 LAB — COMPREHENSIVE METABOLIC PANEL
ALT: 21 U/L (ref 0–44)
AST: 36 U/L (ref 15–41)
Albumin: 3.9 g/dL (ref 3.5–5.0)
Alkaline Phosphatase: 57 U/L (ref 38–126)
Anion gap: 12 (ref 5–15)
BUN: 18 mg/dL (ref 8–23)
CO2: 19 mmol/L — ABNORMAL LOW (ref 22–32)
Calcium: 8.4 mg/dL — ABNORMAL LOW (ref 8.9–10.3)
Chloride: 104 mmol/L (ref 98–111)
Creatinine, Ser: 1.15 mg/dL — ABNORMAL HIGH (ref 0.44–1.00)
GFR, Estimated: 53 mL/min — ABNORMAL LOW (ref >60)
Glucose, Bld: 131 mg/dL — ABNORMAL HIGH (ref 70–99)
Potassium: 4 mmol/L (ref 3.5–5.1)
Sodium: 135 mmol/L (ref 135–145)
Total Bilirubin: 0.9 mg/dL (ref 0.3–1.2)
Total Protein: 7.4 g/dL (ref 6.5–8.1)

## 2023-06-27 LAB — ETHANOL: Alcohol, Ethyl (B): <10 mg/dL (ref <10)

## 2023-06-27 LAB — CBC
HCT: 46.5 % — ABNORMAL HIGH (ref 36.0–46.0)
Hemoglobin: 15.3 g/dL — ABNORMAL HIGH (ref 12.0–15.0)
MCH: 33.2 pg (ref 26.0–34.0)
MCHC: 32.9 g/dL (ref 30.0–36.0)
MCV: 100.9 fL — ABNORMAL HIGH (ref 80.0–100.0)
Platelets: 296 10*3/uL (ref 150–400)
RBC: 4.61 MIL/uL (ref 3.87–5.11)
RDW: 14.8 % (ref 11.5–15.5)
WBC: 15.9 10*3/uL — ABNORMAL HIGH (ref 4.0–10.5)
nRBC: 0 % (ref 0.0–0.2)

## 2023-06-27 LAB — RAPID URINE DRUG SCREEN, HOSP PERFORMED
Amphetamines: NOT DETECTED
Barbiturates: NOT DETECTED
Benzodiazepines: NOT DETECTED
Cocaine: POSITIVE — AB
Opiates: NOT DETECTED
Tetrahydrocannabinol: POSITIVE — AB

## 2023-06-27 MED ORDER — NALOXONE HCL 4 MG/0.1ML NA LIQD: 1.0000 | Freq: Once | NASAL | 0 refills | Status: AC

## 2023-06-27 NOTE — Discharge Instructions (Addendum)
1.  You have been given a prescription to use an emergency overdose situation.  Fill this prescription and have it with you at all times if you have not completely stopped using narcotic medications. 2.  A resource guide has been attached to your discharge instructions.  There are community resources and clinics for treatment for dependence counseling.

## 2023-06-27 NOTE — ED Provider Notes (Signed)
[place] EMERGENCY DEPARTMENT AT [facility] Provider Note   CSN: [id number] Arrival date & time: 06/27/23  1149     History  Chief Complaint  Patient presents with   Drug Overdose    Traci Buchanan is a 67 y.o. female.  HPI Patient reports some friends came in from out of town to visit.  She reports they were going to smoke something together.  She reports what ever it was that they smoke was not when she thought it was go to be.  Patient ended up unresponsive and EMS was called.  She was given Narcan 1 mg IV with improvement in mental status.  Triage note reports patient endorsed smoking crack, weed and drinking some beer and liquor.    Home Medications Prior to Admission medications   Medication Sig Start Date End Date Taking? Authorizing Provider  naloxone [facility]) nasal spray 4 mg/0.1 mL Place 1 spray into the nose once for 1 dose. 06/27/23 06/27/23 Yes Arby Barrette, MD  albuterol (PROVENTIL HFA;VENTOLIN HFA) 108 (90 Base) MCG/ACT inhaler Inhale 1-2 puffs into the lungs every 4 (four) hours as needed for wheezing or shortness of breath (or cough). 11/23/17   Street, [place], PA-C  ibuprofen (ADVIL) 600 MG tablet Take 1 tablet (600 mg total) by mouth every 6 (six) hours as needed. 11/19/21   Leath-Warren, Sadie [place]er, NP  Oxycodone HCl 10 MG TABS Take 1 tablet (10 mg total) by mouth 3 (three) times daily as needed. 06/12/23         Allergies    Patient has no known allergies.    Review of Systems   Review of Systems  Physical Exam Updated Vital Signs BP (!) 138/101   Pulse 65   Temp (!) 97.4 F (36.3 C) (Axillary)   Resp 10   SpO2 97%  Physical Exam Constitutional:      Comments: Patient resting quietly in bed.  No acute distress.  She awakens easily to light voice.  Oxygen saturation is 100% on room air.  Heart rate is in the 60s narrow complex sinus rhythm.  HENT:     Head: Normocephalic and atraumatic.     Mouth/Throat:     Pharynx: Oropharynx is  clear.  Eyes:     Extraocular Movements: Extraocular movements intact.  Cardiovascular:     Rate and Rhythm: Normal rate and regular rhythm.  Pulmonary:     Effort: Pulmonary effort is normal.     Breath sounds: Normal breath sounds.  Abdominal:     General: There is no distension.     Palpations: Abdomen is soft.     Tenderness: There is no abdominal tenderness. There is no guarding.  Musculoskeletal:        General: No deformity. Normal range of motion.  Skin:    General: Skin is warm and dry.  Neurological:     General: No focal deficit present.     Mental Status: She is oriented to person, place, and time.     Coordination: Coordination normal.     ED Results / Procedures / Treatments   Labs (all labs ordered are listed, but only abnormal results are displayed) Labs Reviewed  COMPREHENSIVE METABOLIC PANEL - Abnormal; Notable for the following components:      Result Value   CO2 19 (*)    Glucose, Bld 131 (*)    Creatinine, Ser 1.15 (*)    Calcium 8.4 (*)    GFR, Estimated 53 (*)  All other components within normal limits  CBC - Abnormal; Notable for the following components:   WBC 15.9 (*)    Hemoglobin 15.3 (*)    HCT 46.5 (*)    MCV 100.9 (*)    All other components within normal limits  ETHANOL  RAPID URINE DRUG SCREEN, HOSP PERFORMED    EKG EKG Interpretation Date/Time:  Friday June 27 2023 12:15:04 EDT Ventricular Rate:  52 PR Interval:  156 QRS Duration:  85 QT Interval:  518 QTC Calculation: 482 R Axis:   83  Text Interpretation: Sinus rhythm normal, normalization of biphasic t waves on previous. Confirmed by Arby Barrette [phone]) on 06/27/2023 12:51:03 PM  Radiology No results found.  Procedures Procedures    Medications Ordered in ED Medications - No data to display  ED Course/ Medical Decision Making/ A&P                                 Medical Decision Making Amount and/or Complexity of Data Reviewed Labs:  ordered.   Patient was brought in by EMS.  She responded to IV Narcan.  Upon my excess meant the patient is resting but has no respiratory depression.  She is responding to light stimulus and appears situationally oriented.  Will continue to observe.  15: 34 patient is alert and interactive.  No distress.  Mental status is clear.  Vital signs stable.  Patient has not required any repeat intervention or Narcan administration.  At this time I do feel she is cleared from perspective of the acute overdose.  Patient expresses interest in resource guide for seeking additional treatment for drug dependence.  Patient has supportive family members at bedside at discharge.  I have reviewed this plan with them as well.        Final Clinical Impression(s) / ED Diagnoses Final diagnoses:  Opiate overdose, accidental or unintentional, initial encounter [facility])    Rx / DC Orders ED Discharge Orders          Ordered    naloxone (NARCAN) nasal spray 4 mg/0.1 mL   Once        06/27/23 1532              Arby Barrette, MD 06/27/23 1535

## 2023-06-27 NOTE — ED Triage Notes (Signed)
BIBA from home for overdose. Pt given 1 mg IV narcan PTA due to confusion, and GCS of 9. Pt endorses smoking crack, weed, and drinking some beer and liquor. 173/82 BP 58 HR 100% room air 198 cbg

## 2023-06-28 IMAGING — DX DG ANKLE COMPLETE 3+V*R*
3 series · 3 of 3 positions shown · non-contrast
Comparison: None.

CLINICAL DATA: Stepped into a hole.  Twisted right foot and ankle.

EXAM:
RIGHT FOOT COMPLETE - 3+ VIEW; RIGHT ANKLE - COMPLETE 3+ VIEW

[ankle ap]
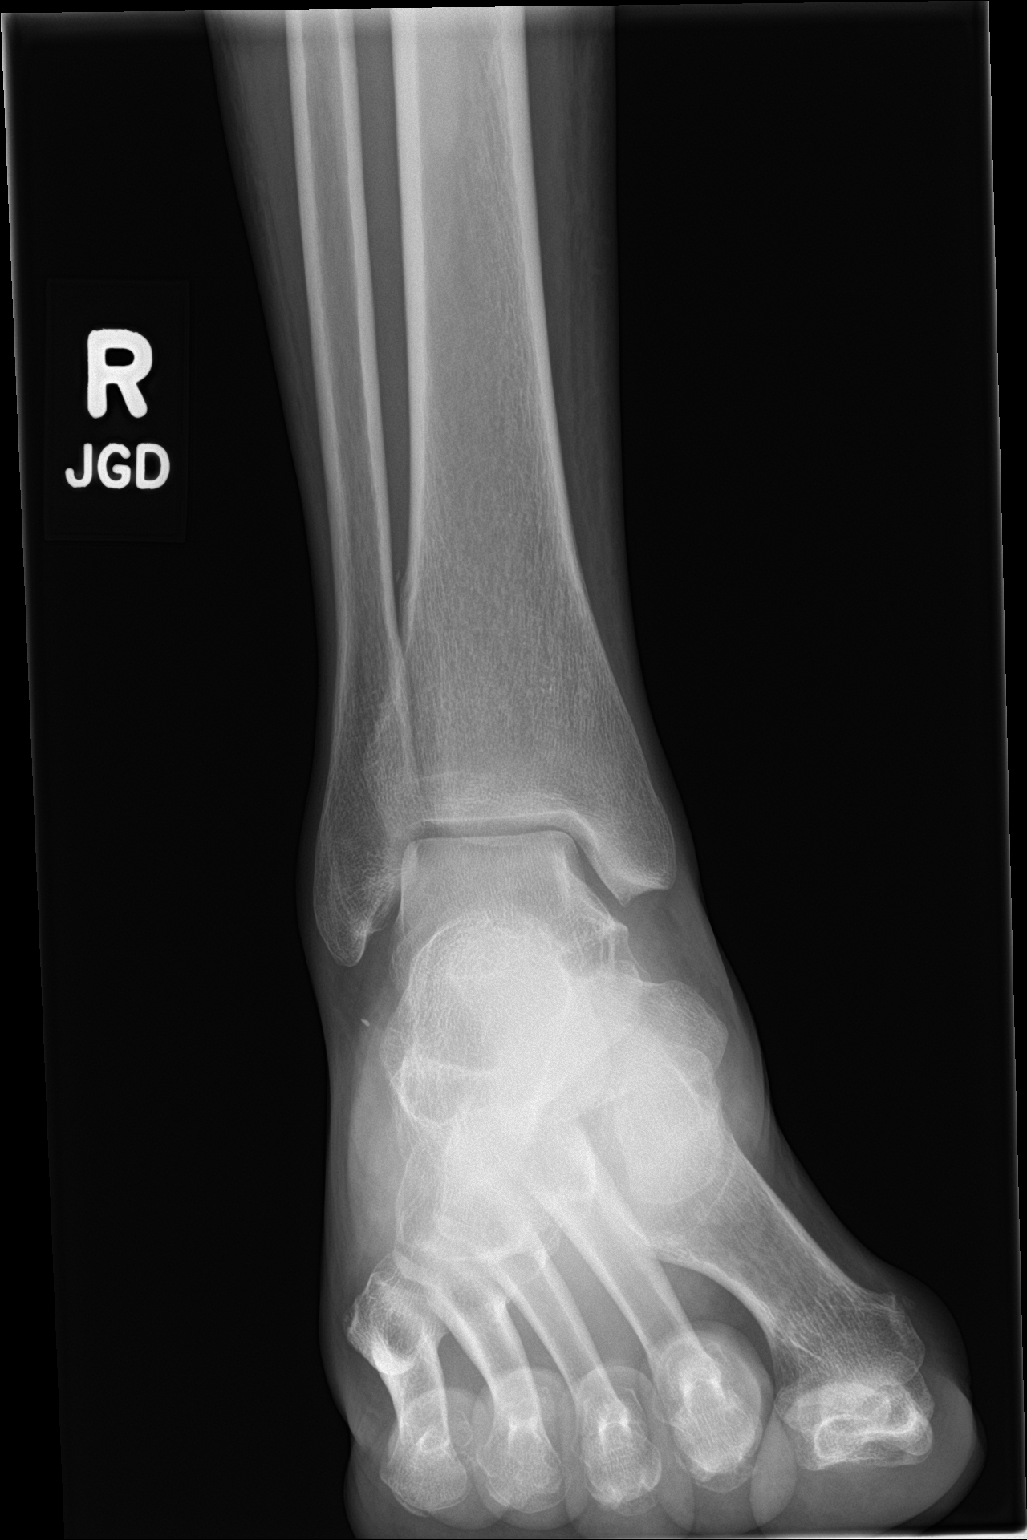

[ankle obl]
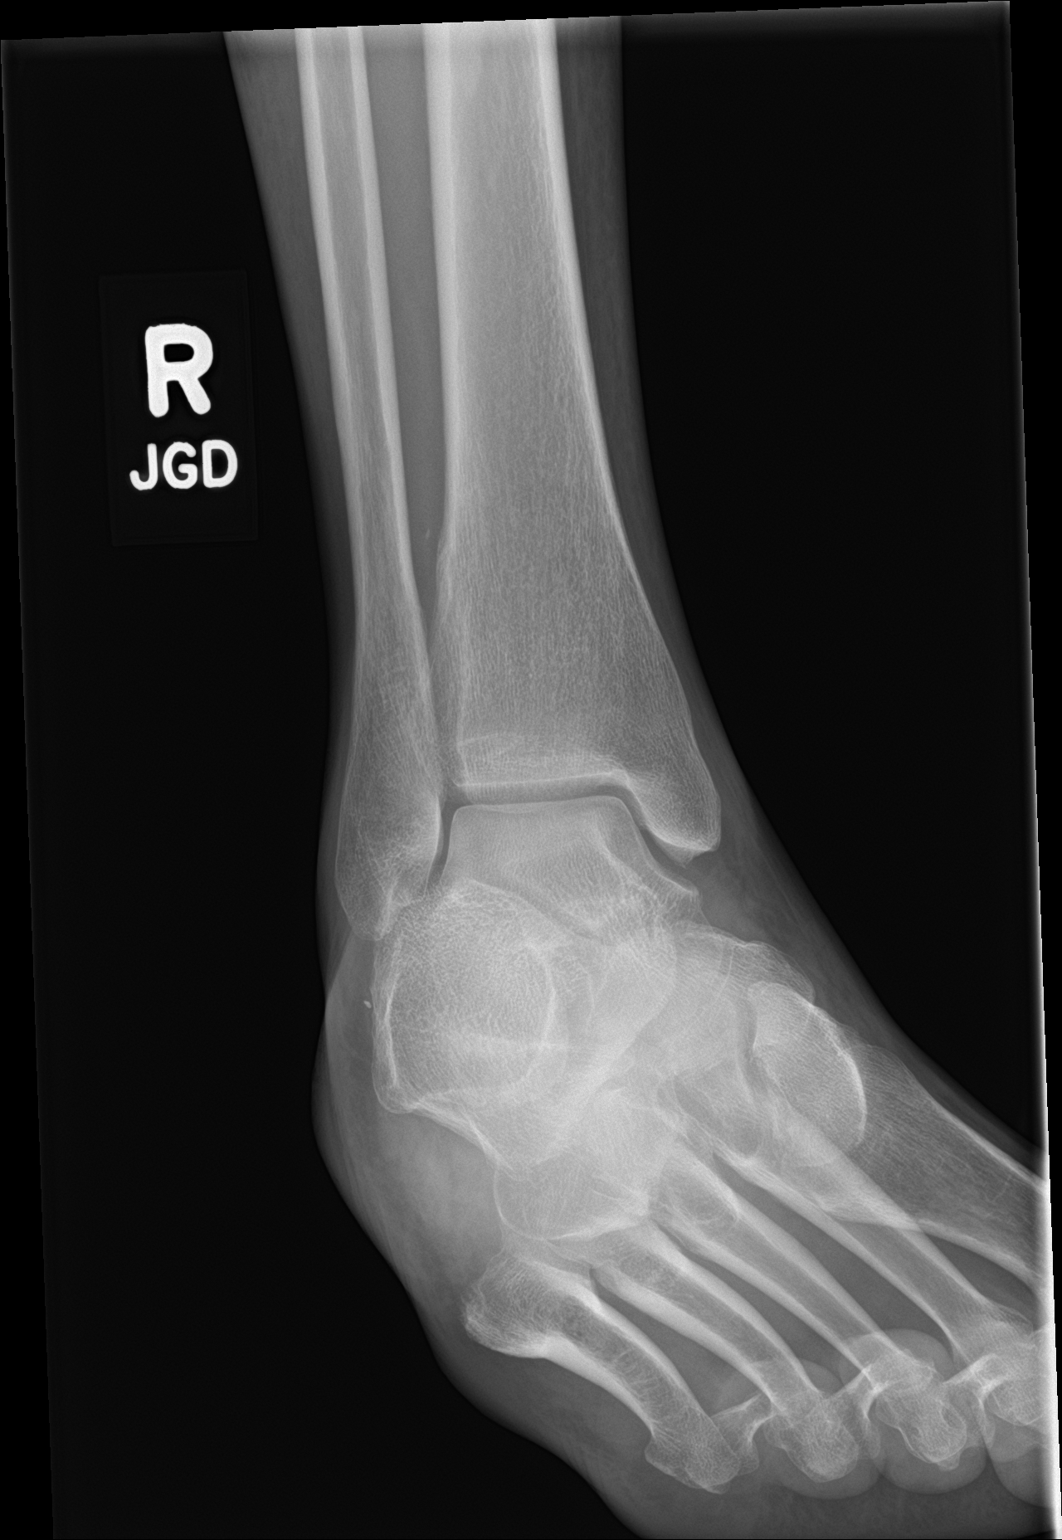

[ankle lat]
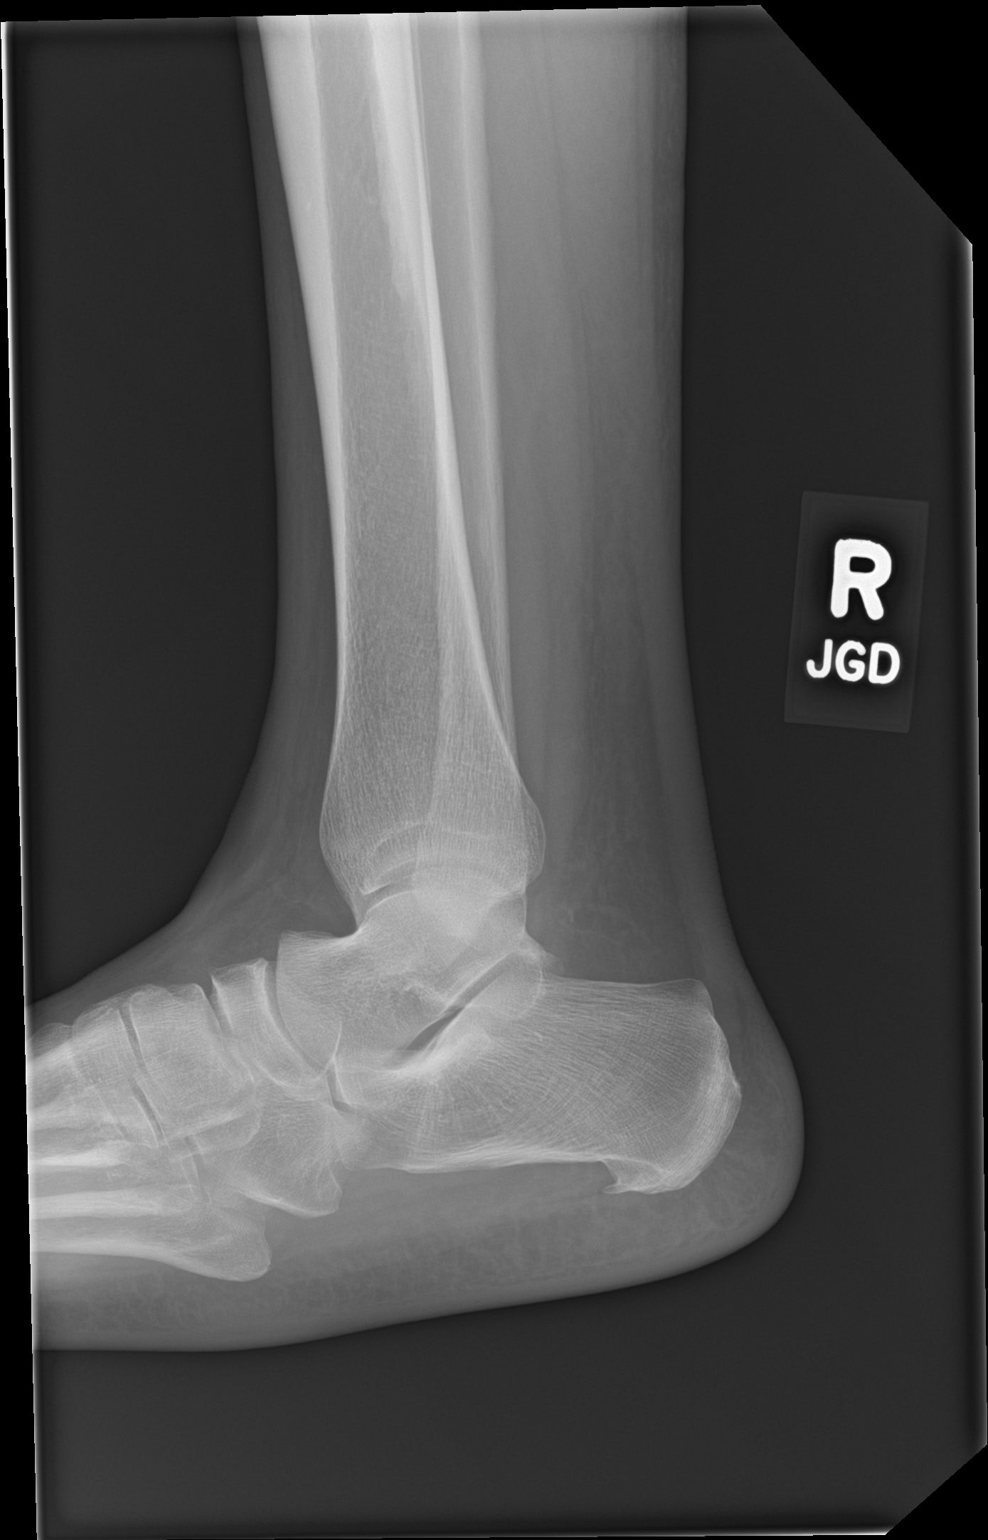

[3 of 3 positions shown; findings below may reference images not displayed]

FINDINGS: Right ankle:

The ankle mortise is symmetric and intact. Minimal distal medial
malleolar degenerative osteophytosis. Small calcaneal heel spur.
Mild dorsal talonavicular degenerative osteophytosis. Mild pes
planus.

Right foot:

Minimal hallux valgus. Mild great toe metatarsophalangeal joint
space narrowing and peripheral osteophytosis. No acute fracture or
dislocation.
IMPRESSION: :
IMPRESSION: 1. Small calcaneal heel spur.
2. Mild pes planus.
3. No acute fracture.

## 2023-07-09 ENCOUNTER — Other Ambulatory Visit (HOSPITAL_COMMUNITY): Payer: Self-pay

## 2023-07-09 MED ORDER — OXYCODONE HCL 10 MG PO TABS
10.0000 mg | ORAL_TABLET | Freq: Four times a day (QID) | ORAL | 0 refills | Status: DC
Start: 2023-07-09 — End: 2023-08-05
  Filled 2023-07-09: qty 120, 30d supply, fill #0

## 2023-07-10 ENCOUNTER — Other Ambulatory Visit (HOSPITAL_COMMUNITY): Payer: Self-pay

## 2023-08-05 ENCOUNTER — Other Ambulatory Visit (HOSPITAL_COMMUNITY): Payer: Self-pay

## 2023-08-05 MED ORDER — OXYCODONE HCL 10 MG PO TABS
10.0000 mg | ORAL_TABLET | Freq: Four times a day (QID) | ORAL | 0 refills | Status: DC
Start: 2023-08-05 — End: 2023-09-03
  Filled 2023-08-06: qty 120, 30d supply, fill #0

## 2023-08-06 ENCOUNTER — Other Ambulatory Visit (HOSPITAL_COMMUNITY): Payer: Self-pay

## 2023-09-03 ENCOUNTER — Other Ambulatory Visit (HOSPITAL_COMMUNITY): Payer: Self-pay

## 2023-09-03 MED ORDER — OXYCODONE HCL 10 MG PO TABS
10.0000 mg | ORAL_TABLET | Freq: Four times a day (QID) | ORAL | 0 refills | Status: DC
Start: 2023-09-02 — End: 2023-10-01
  Filled 2023-09-03 – 2023-09-05 (×3): qty 120, 30d supply, fill #0

## 2023-09-05 ENCOUNTER — Other Ambulatory Visit: Payer: Self-pay

## 2023-09-05 ENCOUNTER — Other Ambulatory Visit (HOSPITAL_COMMUNITY): Payer: Self-pay

## 2023-10-01 ENCOUNTER — Other Ambulatory Visit (HOSPITAL_COMMUNITY): Payer: Self-pay

## 2023-10-01 DIAGNOSIS — Z0000 Encounter for general adult medical examination without abnormal findings: Secondary | ICD-10-CM | POA: Diagnosis not present

## 2023-10-01 DIAGNOSIS — Z79899 Other long term (current) drug therapy: Secondary | ICD-10-CM | POA: Diagnosis not present

## 2023-10-01 DIAGNOSIS — Z681 Body mass index (BMI) 19 or less, adult: Secondary | ICD-10-CM | POA: Diagnosis not present

## 2023-10-01 DIAGNOSIS — M5030 Other cervical disc degeneration, unspecified cervical region: Secondary | ICD-10-CM | POA: Diagnosis not present

## 2023-10-01 MED ORDER — OXYCODONE HCL 10 MG PO TABS
10.0000 mg | ORAL_TABLET | Freq: Four times a day (QID) | ORAL | 0 refills | Status: DC
Start: 2023-10-01 — End: 2023-10-31
  Filled 2023-10-01 – 2023-10-03 (×2): qty 120, 30d supply, fill #0

## 2023-10-02 ENCOUNTER — Other Ambulatory Visit (HOSPITAL_COMMUNITY): Payer: Self-pay

## 2023-10-02 ENCOUNTER — Other Ambulatory Visit (HOSPITAL_BASED_OUTPATIENT_CLINIC_OR_DEPARTMENT_OTHER): Payer: Self-pay

## 2023-10-03 ENCOUNTER — Other Ambulatory Visit (HOSPITAL_COMMUNITY): Payer: Self-pay

## 2023-10-03 ENCOUNTER — Other Ambulatory Visit: Payer: Self-pay

## 2023-10-03 DIAGNOSIS — Z79899 Other long term (current) drug therapy: Secondary | ICD-10-CM | POA: Diagnosis not present

## 2023-10-31 ENCOUNTER — Other Ambulatory Visit (HOSPITAL_COMMUNITY): Payer: Self-pay

## 2023-10-31 DIAGNOSIS — Z681 Body mass index (BMI) 19 or less, adult: Secondary | ICD-10-CM | POA: Diagnosis not present

## 2023-10-31 DIAGNOSIS — M5030 Other cervical disc degeneration, unspecified cervical region: Secondary | ICD-10-CM | POA: Diagnosis not present

## 2023-10-31 DIAGNOSIS — Z79899 Other long term (current) drug therapy: Secondary | ICD-10-CM | POA: Diagnosis not present

## 2023-10-31 DIAGNOSIS — R030 Elevated blood-pressure reading, without diagnosis of hypertension: Secondary | ICD-10-CM | POA: Diagnosis not present

## 2023-10-31 MED ORDER — OXYCODONE HCL 10 MG PO TABS
10.0000 mg | ORAL_TABLET | Freq: Four times a day (QID) | ORAL | 0 refills | Status: DC
Start: 2023-10-31 — End: 2023-12-01
  Filled 2023-10-31: qty 120, 30d supply, fill #0

## 2023-11-28 ENCOUNTER — Other Ambulatory Visit (HOSPITAL_COMMUNITY): Payer: Self-pay

## 2023-11-28 DIAGNOSIS — Z9181 History of falling: Secondary | ICD-10-CM | POA: Diagnosis not present

## 2023-11-28 DIAGNOSIS — Z79899 Other long term (current) drug therapy: Secondary | ICD-10-CM | POA: Diagnosis not present

## 2023-11-28 DIAGNOSIS — M5030 Other cervical disc degeneration, unspecified cervical region: Secondary | ICD-10-CM | POA: Diagnosis not present

## 2023-11-28 DIAGNOSIS — E7800 Pure hypercholesterolemia, unspecified: Secondary | ICD-10-CM | POA: Diagnosis not present

## 2023-11-28 DIAGNOSIS — R030 Elevated blood-pressure reading, without diagnosis of hypertension: Secondary | ICD-10-CM | POA: Diagnosis not present

## 2023-11-28 DIAGNOSIS — Z681 Body mass index (BMI) 19 or less, adult: Secondary | ICD-10-CM | POA: Diagnosis not present

## 2023-12-01 ENCOUNTER — Other Ambulatory Visit (HOSPITAL_COMMUNITY): Payer: Self-pay

## 2023-12-01 MED ORDER — OXYCODONE HCL 10 MG PO TABS
10.0000 mg | ORAL_TABLET | Freq: Four times a day (QID) | ORAL | 0 refills | Status: DC
Start: 2023-11-28 — End: 2023-12-29
  Filled 2023-12-01: qty 120, 30d supply, fill #0

## 2023-12-03 DIAGNOSIS — Z79899 Other long term (current) drug therapy: Secondary | ICD-10-CM | POA: Diagnosis not present

## 2023-12-29 ENCOUNTER — Other Ambulatory Visit (HOSPITAL_COMMUNITY): Payer: Self-pay

## 2023-12-29 DIAGNOSIS — M5030 Other cervical disc degeneration, unspecified cervical region: Secondary | ICD-10-CM | POA: Diagnosis not present

## 2023-12-29 DIAGNOSIS — Z9181 History of falling: Secondary | ICD-10-CM | POA: Diagnosis not present

## 2023-12-29 DIAGNOSIS — Z681 Body mass index (BMI) 19 or less, adult: Secondary | ICD-10-CM | POA: Diagnosis not present

## 2023-12-29 DIAGNOSIS — Z79899 Other long term (current) drug therapy: Secondary | ICD-10-CM | POA: Diagnosis not present

## 2023-12-29 DIAGNOSIS — F17210 Nicotine dependence, cigarettes, uncomplicated: Secondary | ICD-10-CM | POA: Diagnosis not present

## 2023-12-29 DIAGNOSIS — E7800 Pure hypercholesterolemia, unspecified: Secondary | ICD-10-CM | POA: Diagnosis not present

## 2023-12-29 DIAGNOSIS — R030 Elevated blood-pressure reading, without diagnosis of hypertension: Secondary | ICD-10-CM | POA: Diagnosis not present

## 2023-12-29 MED ORDER — OXYCODONE HCL 10 MG PO TABS
10.0000 mg | ORAL_TABLET | Freq: Four times a day (QID) | ORAL | 0 refills | Status: DC
Start: 2023-12-29 — End: 2024-01-29
  Filled 2023-12-29: qty 120, 30d supply, fill #0

## 2023-12-30 ENCOUNTER — Other Ambulatory Visit (HOSPITAL_COMMUNITY): Payer: Self-pay

## 2023-12-31 DIAGNOSIS — Z79899 Other long term (current) drug therapy: Secondary | ICD-10-CM | POA: Diagnosis not present

## 2024-01-29 ENCOUNTER — Other Ambulatory Visit (HOSPITAL_COMMUNITY): Payer: Self-pay

## 2024-01-29 DIAGNOSIS — R5383 Other fatigue: Secondary | ICD-10-CM | POA: Diagnosis not present

## 2024-01-29 DIAGNOSIS — Z780 Asymptomatic menopausal state: Secondary | ICD-10-CM | POA: Diagnosis not present

## 2024-01-29 DIAGNOSIS — M5030 Other cervical disc degeneration, unspecified cervical region: Secondary | ICD-10-CM | POA: Diagnosis not present

## 2024-01-29 DIAGNOSIS — Z1211 Encounter for screening for malignant neoplasm of colon: Secondary | ICD-10-CM | POA: Diagnosis not present

## 2024-01-29 DIAGNOSIS — Z79899 Other long term (current) drug therapy: Secondary | ICD-10-CM | POA: Diagnosis not present

## 2024-01-29 DIAGNOSIS — E7800 Pure hypercholesterolemia, unspecified: Secondary | ICD-10-CM | POA: Diagnosis not present

## 2024-01-29 DIAGNOSIS — Z87891 Personal history of nicotine dependence: Secondary | ICD-10-CM | POA: Diagnosis not present

## 2024-01-29 DIAGNOSIS — R7303 Prediabetes: Secondary | ICD-10-CM | POA: Diagnosis not present

## 2024-01-29 DIAGNOSIS — D539 Nutritional anemia, unspecified: Secondary | ICD-10-CM | POA: Diagnosis not present

## 2024-01-29 DIAGNOSIS — R29818 Other symptoms and signs involving the nervous system: Secondary | ICD-10-CM | POA: Diagnosis not present

## 2024-01-29 MED ORDER — NALOXONE HCL 4 MG/0.1ML NA LIQD: 1.0000 | NASAL | 1 refills | Status: AC | PRN

## 2024-01-29 MED ORDER — OXYCODONE HCL 10 MG PO TABS
10.0000 mg | ORAL_TABLET | Freq: Four times a day (QID) | ORAL | 0 refills | Status: DC
  Filled 2024-01-29: qty 120, 30d supply, fill #0

## 2024-02-03 ENCOUNTER — Other Ambulatory Visit: Payer: Self-pay | Admitting: Physician Assistant

## 2024-02-03 DIAGNOSIS — Z1231 Encounter for screening mammogram for malignant neoplasm of breast: Secondary | ICD-10-CM

## 2024-02-04 DIAGNOSIS — Z79899 Other long term (current) drug therapy: Secondary | ICD-10-CM | POA: Diagnosis not present

## 2024-02-18 ENCOUNTER — Inpatient Hospital Stay: Admission: RE | Admit: 2024-02-18 | Source: Ambulatory Visit

## 2024-02-18 ENCOUNTER — Ambulatory Visit

## 2024-03-02 ENCOUNTER — Other Ambulatory Visit: Payer: Self-pay

## 2024-03-02 ENCOUNTER — Other Ambulatory Visit (HOSPITAL_COMMUNITY): Payer: Self-pay

## 2024-03-02 MED ORDER — OXYCODONE HCL 10 MG PO TABS
10.0000 mg | ORAL_TABLET | Freq: Four times a day (QID) | ORAL | 0 refills | Status: AC
Start: 2024-03-02 — End: ?
  Filled 2024-03-02: qty 120, 30d supply, fill #0

## 2024-03-02 MED ORDER — NALOXONE HCL 4 MG/0.1ML NA LIQD: 1.0000 | NASAL | 1 refills | Status: AC

## 2024-03-03 ENCOUNTER — Ambulatory Visit

## 2024-03-11 ENCOUNTER — Ambulatory Visit

## 2024-07-15 ENCOUNTER — Other Ambulatory Visit (HOSPITAL_BASED_OUTPATIENT_CLINIC_OR_DEPARTMENT_OTHER): Payer: Self-pay
